# Patient Record
Sex: Female | Born: 1945 | Race: White | Hispanic: No | Marital: Single | Smoking: Never smoker
Health system: Southern US, Community
[De-identification: ages and names within clinical notes are randomized; demographics above are authoritative.]

## PROBLEM LIST (undated history)

## (undated) DIAGNOSIS — I10 Essential (primary) hypertension: Secondary | ICD-10-CM

## (undated) DIAGNOSIS — J45909 Unspecified asthma, uncomplicated: Secondary | ICD-10-CM

## (undated) DIAGNOSIS — K219 Gastro-esophageal reflux disease without esophagitis: Secondary | ICD-10-CM

## (undated) DIAGNOSIS — E785 Hyperlipidemia, unspecified: Secondary | ICD-10-CM

## (undated) HISTORY — DX: Gastro-esophageal reflux disease without esophagitis: K21.9

## (undated) HISTORY — PX: RHINOPLASTY: SUR1284

## (undated) HISTORY — PX: APPENDECTOMY: SHX54

## (undated) HISTORY — DX: Hyperlipidemia, unspecified: E78.5

## (undated) HISTORY — DX: Unspecified asthma, uncomplicated: J45.909

## (undated) HISTORY — DX: Essential (primary) hypertension: I10

---

## 2003-11-18 HISTORY — PX: JOINT REPLACEMENT: SHX530

## 2012-10-05 ENCOUNTER — Encounter: Payer: Self-pay | Admitting: Internal Medicine

## 2012-10-05 ENCOUNTER — Ambulatory Visit (INDEPENDENT_AMBULATORY_CARE_PROVIDER_SITE_OTHER): Payer: 59 | Admitting: Internal Medicine

## 2012-10-05 VITALS — BP 142/80 | HR 72 | Temp 97.8°F | Resp 18 | Ht 66.0 in | Wt 174.0 lb

## 2012-10-05 DIAGNOSIS — Z0000 Encounter for general adult medical examination without abnormal findings: Secondary | ICD-10-CM

## 2012-10-05 DIAGNOSIS — Z86010 Personal history of colonic polyps: Secondary | ICD-10-CM | POA: Insufficient documentation

## 2012-10-05 DIAGNOSIS — E785 Hyperlipidemia, unspecified: Secondary | ICD-10-CM | POA: Insufficient documentation

## 2012-10-05 DIAGNOSIS — J45909 Unspecified asthma, uncomplicated: Secondary | ICD-10-CM | POA: Insufficient documentation

## 2012-10-05 DIAGNOSIS — I10 Essential (primary) hypertension: Secondary | ICD-10-CM

## 2012-10-05 DIAGNOSIS — K219 Gastro-esophageal reflux disease without esophagitis: Secondary | ICD-10-CM | POA: Insufficient documentation

## 2012-10-05 MED ORDER — ALBUTEROL SULFATE HFA 108 (90 BASE) MCG/ACT IN AERS: 2.0000 | INHALATION_SPRAY | Freq: Four times a day (QID) | RESPIRATORY_TRACT | Status: DC | PRN

## 2012-10-05 MED ORDER — ATORVASTATIN CALCIUM 20 MG PO TABS: 20.0000 mg | ORAL_TABLET | Freq: Every day | ORAL | Status: DC

## 2012-10-05 MED ORDER — BENAZEPRIL HCL 20 MG PO TABS: 20.0000 mg | ORAL_TABLET | Freq: Every day | ORAL | Status: DC

## 2012-10-05 MED ORDER — TRIAMTERENE-HCTZ 37.5-25 MG PO CAPS: 1.0000 | ORAL_CAPSULE | ORAL | Status: DC

## 2012-10-05 MED ORDER — MONTELUKAST SODIUM 10 MG PO TABS: 10.0000 mg | ORAL_TABLET | Freq: Every day | ORAL | Status: DC

## 2012-10-05 MED ORDER — OMEPRAZOLE MAGNESIUM 20 MG PO TBEC: 20.0000 mg | DELAYED_RELEASE_TABLET | Freq: Every day | ORAL | Status: DC

## 2012-10-05 NOTE — Progress Notes (Signed)
  Subjective:    Patient ID: Judy [name], female    DOB: May 09, 1946, 66 y.o.   MRN: [id number]  HPI  66 year old patient who is seen today to establish with our practice. Past medical history is pertinent for hypertension dyslipidemia and a history of mild asthma. This has been well-controlled and she has only rare albuterol rescue use. 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  HENT: Negative for hearing loss, ear pain, nosebleeds, congestion, sore throat, mouth sores, trouble swallowing, neck stiffness, dental problem, voice change, sinus pressure and tinnitus.   Eyes: Negative for photophobia, pain, redness and visual disturbance.  Respiratory: Negative for cough, chest tightness and shortness of breath.   Cardiovascular: Negative for chest pain, palpitations and leg swelling.  Gastrointestinal: Negative for nausea, vomiting, abdominal pain, diarrhea, constipation, blood in stool, abdominal distention and rectal pain.  Genitourinary: Negative for dysuria, urgency, frequency, hematuria, flank pain, vaginal bleeding, vaginal discharge, difficulty urinating, genital sores, vaginal pain, menstrual problem and pelvic pain.  Musculoskeletal: Negative for back pain and arthralgias.  Skin: Negative for rash.  Neurological: Negative for dizziness, syncope, speech difficulty, weakness, light-headedness, numbness and headaches.  Hematological: Negative for adenopathy. Does not bruise/bleed easily.  Psychiatric/Behavioral: Negative for suicidal ideas, behavioral problems, self-injury, dysphoric mood and agitation. The patient is not  nervous/anxious.        Objective:   Physical Exam  Constitutional: She is oriented to person, place, and time. She appears well-developed and well-nourished.  HENT:  Head: Normocephalic and atraumatic.  Right Ear: External ear normal.  Left Ear: External ear normal.  Mouth/Throat: Oropharynx is clear and moist.  Eyes: Conjunctivae normal and EOM are normal.  Neck: Normal range of motion. Neck supple. No JVD present. No thyromegaly present.  Cardiovascular: Normal rate, regular rhythm, normal heart sounds and intact distal pulses.   No murmur heard. Pulmonary/Chest: Effort normal and breath sounds normal. She has no wheezes. She has no rales.  Abdominal: Soft. Bowel sounds are normal. She exhibits no distension and no mass. There is no tenderness. There is no rebound and no guarding.  Musculoskeletal: Normal range of motion. She exhibits no edema and no tenderness.  Neurological: She is alert and oriented to person, place, and time. She has normal reflexes. No cranial nerve deficit. She exhibits normal muscle tone. Coordination normal.  Skin: Skin is warm and dry. No rash noted.  Psychiatric: She has a normal mood and affect. Her behavior is normal.          Assessment & Plan:   Preventive health exam Hypertension controlled Dyslipidemia Mild stable asthma Gastroesophageal reflux disease  Recheck 1 year with lab

## 2012-10-05 NOTE — Patient Instructions (Addendum)
Limit your sodium (Salt) intake    It is important that you exercise regularly, at least 20 minutes 3 to 4 times per week.  If you develop chest pain or shortness of breath seek  medical attention.  Return in one year for follow-up  Schedule your mammogram.  

## 2012-11-29 ENCOUNTER — Other Ambulatory Visit (INDEPENDENT_AMBULATORY_CARE_PROVIDER_SITE_OTHER): Payer: 59

## 2012-11-29 DIAGNOSIS — Z0000 Encounter for general adult medical examination without abnormal findings: Secondary | ICD-10-CM

## 2012-11-29 LAB — CBC WITH DIFFERENTIAL/PLATELET
Basophils Absolute: 0 10*3/uL (ref 0.0–0.1)
Eosinophils Absolute: 0.2 10*3/uL (ref 0.0–0.7)
HCT: 39.2 % (ref 36.0–46.0)
Lymphs Abs: 1.6 10*3/uL (ref 0.7–4.0)
MCHC: 33.6 g/dL (ref 30.0–36.0)
Monocytes Absolute: 1.1 10*3/uL — ABNORMAL HIGH (ref 0.1–1.0)
Monocytes Relative: 16 % — ABNORMAL HIGH (ref 3.0–12.0)
Platelets: 289 10*3/uL (ref 150.0–400.0)
RDW: 13.1 % (ref 11.5–14.6)

## 2012-11-29 LAB — POCT URINALYSIS DIPSTICK
Bilirubin, UA: NEGATIVE
Glucose, UA: NEGATIVE
Nitrite, UA: NEGATIVE
Urobilinogen, UA: 0.2

## 2012-11-29 LAB — LIPID PANEL
HDL: 70.4 mg/dL (ref >39.00)
Triglycerides: 54 mg/dL (ref 0.0–149.0)

## 2012-11-29 LAB — HEPATIC FUNCTION PANEL
AST: 17 U/L (ref 0–37)
Total Bilirubin: 0.6 mg/dL (ref 0.3–1.2)

## 2012-11-29 LAB — BASIC METABOLIC PANEL
BUN: 28 mg/dL — ABNORMAL HIGH (ref 6–23)
CO2: 25 mEq/L (ref 19–32)
GFR: 52.47 mL/min — ABNORMAL LOW (ref >60.00)
Glucose, Bld: 90 mg/dL (ref 70–99)
Potassium: 4.2 mEq/L (ref 3.5–5.1)

## 2012-11-29 LAB — TSH: TSH: 2.34 u[IU]/mL (ref 0.35–5.50)

## 2012-11-30 ENCOUNTER — Other Ambulatory Visit: Payer: 59

## 2012-12-06 ENCOUNTER — Encounter: Payer: 59 | Admitting: Internal Medicine

## 2013-01-01 ENCOUNTER — Other Ambulatory Visit: Payer: Self-pay

## 2013-04-18 ENCOUNTER — Encounter: Payer: Self-pay | Admitting: Family Medicine

## 2013-04-18 ENCOUNTER — Ambulatory Visit (INDEPENDENT_AMBULATORY_CARE_PROVIDER_SITE_OTHER): Payer: Medicare Other | Admitting: Family Medicine

## 2013-04-18 VITALS — BP 102/72 | Temp 98.5°F | Wt 174.0 lb

## 2013-04-18 DIAGNOSIS — H6121 Impacted cerumen, right ear: Secondary | ICD-10-CM

## 2013-04-18 DIAGNOSIS — H6120 Impacted cerumen, unspecified ear: Secondary | ICD-10-CM

## 2013-04-18 NOTE — Progress Notes (Signed)
Chief Complaint  Patient presents with  . right ear pain    HPI:  Acute visit for R ear fullness: -started about 4-5 days ago -has been on several flights -feels like ear wax as has had this issue before and had ear wax lavage for this -symptoms: nasal congestion, PND, R ear feels full, a little vertigo - has had vertigo in the past and told to take dramamine - which has resolved current vertigo -denies: fevers, sinus pain, SOB, NVD    ROS: See pertinent positives and negatives per HPI.  Past Medical History  Diagnosis Date  . GERD (gastroesophageal reflux disease)   . Hypertension   . Hyperlipidemia   . Asthma     No family history on file.  History   Social History  . Marital Status: Single    Spouse Name: N/A    Number of Children: N/A  . Years of Education: N/A   Social History Main Topics  . Smoking status: Never Smoker   . Smokeless tobacco: Never Used  . Alcohol Use: 1.8 oz/week    3 Glasses of wine per week  . Drug Use: No  . Sexually Active: No   Other Topics Concern  . None   Social History Narrative  . None    Current outpatient prescriptions:albuterol (PROVENTIL HFA;VENTOLIN HFA) 108 (90 BASE) MCG/ACT inhaler, Inhale 2 puffs into the lungs every 6 (six) hours as needed., Disp: 1 Inhaler, Rfl: 6;  atorvastatin (LIPITOR) 20 MG tablet, Take 1 tablet (20 mg total) by mouth daily., Disp: 90 tablet, Rfl: 4;  benazepril (LOTENSIN) 20 MG tablet, Take 1 tablet (20 mg total) by mouth daily., Disp: 90 tablet, Rfl: 5 montelukast (SINGULAIR) 10 MG tablet, Take 1 tablet (10 mg total) by mouth at bedtime., Disp: 90 tablet, Rfl: 5;  Multiple Vitamins-Minerals (MULTIVITAMIN WITH MINERALS) tablet, Take 1 tablet by mouth daily., Disp: , Rfl: ;  naproxen sodium (ANAPROX) 220 MG tablet, Take 220 mg by mouth 1 day or 1 dose., Disp: , Rfl: ;  omeprazole (PRILOSEC OTC) 20 MG tablet, Take 1 tablet (20 mg total) by mouth daily., Disp: 90 tablet, Rfl:  4 triamterene-hydrochlorothiazide (DYAZIDE) 37.5-25 MG per capsule, Take 1 each (1 capsule total) by mouth every morning., Disp: 90 capsule, Rfl: 5  EXAM:  Filed Vitals:   04/18/13 1508  BP: 102/72  Temp: 98.5 F (36.9 C)    Body mass index is 28.1 kg/(m^2).  GENERAL: vitals reviewed and listed above, alert, oriented, appears well hydrated and in no acute distress  HEENT: atraumatic, conjunttiva clear, no obvious abnormalities on inspection of external nose and ears, cerumen impaction R ear canal, normal appearance of ear canals and TMs after removal of ear wax other then some scarring of R TM, clear nasal congestion, mild post oropharyngeal erythema with PND, no tonsillar edema or exudate, no sinus TTP  NECK: no obvious masses on inspection  MS: moves all extremities without noticeable abnormality  PSYCH: pleasant and cooperative, no obvious depression or anxiety  ASSESSMENT AND PLAN:  Discussed the following assessment and plan:  Cerumen impaction, right  -some ear wax removed with soft curette, then nurse removal with lavage after discussion risks - symptoms resolved after removal -return and follow up precautions -Patient advised to return or notify a doctor immediately if symptoms worsen or persist or new concerns arise.  There are no Patient Instructions on file for this visit.   Kriste Basque R.

## 2013-04-20 ENCOUNTER — Telehealth: Payer: Self-pay | Admitting: Internal Medicine

## 2013-04-20 ENCOUNTER — Ambulatory Visit (INDEPENDENT_AMBULATORY_CARE_PROVIDER_SITE_OTHER): Payer: Medicare Other | Admitting: Family Medicine

## 2013-04-20 ENCOUNTER — Encounter: Payer: Self-pay | Admitting: Family Medicine

## 2013-04-20 VITALS — BP 120/80 | Temp 98.3°F | Wt 174.0 lb

## 2013-04-20 DIAGNOSIS — J329 Chronic sinusitis, unspecified: Secondary | ICD-10-CM

## 2013-04-20 MED ORDER — AMOXICILLIN 875 MG PO TABS: 875.0000 mg | ORAL_TABLET | Freq: Two times a day (BID) | ORAL | Status: DC

## 2013-04-20 NOTE — Progress Notes (Signed)
Chief Complaint  Patient presents with  . Sinusitis    HPI:  Sinus congestion: -started a few days ago right after last visit, ears feel better but now having sinus issues -symptoms: nasal congestion, green drainage, sore throat, R sided tooth pain upper and R max sinus pain -denies: fevers, ear pain, SOB, NVD -tried nettie pot -reports hx recurrent sinus infections years ago   ROS: See pertinent positives and negatives per HPI.  Past Medical History  Diagnosis Date  . GERD (gastroesophageal reflux disease)   . Hypertension   . Hyperlipidemia   . Asthma     No family history on file.  History   Social History  . Marital Status: Single    Spouse Name: N/A    Number of Children: N/A  . Years of Education: N/A   Social History Main Topics  . Smoking status: Never Smoker   . Smokeless tobacco: Never Used  . Alcohol Use: 1.8 oz/week    3 Glasses of wine per week  . Drug Use: No  . Sexually Active: No   Other Topics Concern  . None   Social History Narrative  . None    Current outpatient prescriptions:albuterol (PROVENTIL HFA;VENTOLIN HFA) 108 (90 BASE) MCG/ACT inhaler, Inhale 2 puffs into the lungs every 6 (six) hours as needed., Disp: 1 Inhaler, Rfl: 6;  amoxicillin (AMOXIL) 875 MG tablet, Take 1 tablet (875 mg total) by mouth 2 (two) times daily., Disp: 20 tablet, Rfl: 0;  atorvastatin (LIPITOR) 20 MG tablet, Take 1 tablet (20 mg total) by mouth daily., Disp: 90 tablet, Rfl: 4 benazepril (LOTENSIN) 20 MG tablet, Take 1 tablet (20 mg total) by mouth daily., Disp: 90 tablet, Rfl: 5;  montelukast (SINGULAIR) 10 MG tablet, Take 1 tablet (10 mg total) by mouth at bedtime., Disp: 90 tablet, Rfl: 5;  Multiple Vitamins-Minerals (MULTIVITAMIN WITH MINERALS) tablet, Take 1 tablet by mouth daily., Disp: , Rfl: ;  naproxen sodium (ANAPROX) 220 MG tablet, Take 220 mg by mouth 1 day or 1 dose., Disp: , Rfl:  omeprazole (PRILOSEC OTC) 20 MG tablet, Take 1 tablet (20 mg total) by  mouth daily., Disp: 90 tablet, Rfl: 4;  triamterene-hydrochlorothiazide (DYAZIDE) 37.5-25 MG per capsule, Take 1 each (1 capsule total) by mouth every morning., Disp: 90 capsule, Rfl: 5  EXAM:  Filed Vitals:   04/20/13 1143  BP: 120/80  Temp: 98.3 F (36.8 C)    Body mass index is 28.1 kg/(m^2).  GENERAL: vitals reviewed and listed above, alert, oriented, appears well hydrated and in no acute distress  HEENT: atraumatic, conjunttiva clear, no obvious abnormalities on inspection of external nose and ears, normal appearance of ear canals and TMs, thick white nasal congestion R>L, mild post oropharyngeal erythema with PND, no tonsillar edema or exudate, R max sinus TTP  NECK: no obvious masses on inspection  LUNGS: clear to auscultation bilaterally, no wheezes, rales or rhonchi, good air movement  CV: HRRR, no peripheral edema  MS: moves all extremities without noticeable abnormality  PSYCH: pleasant and cooperative, no obvious depression or anxiety  ASSESSMENT AND PLAN:  Discussed the following assessment and plan:  Sinusitis - Plan: amoxicillin (AMOXIL) 875 MG tablet  -given sinus pain and max tooth pain and exam findings discussed abx reasonable. -Recommendations per orders an instructions, risks and use of medications and return precautions discussed. -Patient advised to return or notify a doctor immediately if symptoms worsen or persist or new concerns arise.  Patient Instructions  --As we discussed, we  have prescribed a new medication (AMOXICILLIN) for you at this appointment. We discussed the common and serious potential adverse effects of this medication and you can review these and more with the pharmacist when you pick up your medication.  Please follow the instructions for use carefully and notify us immediately if you have any problems taking this medication.  -follow up as needed     KIM, Dahlia Client R.

## 2013-04-20 NOTE — Telephone Encounter (Signed)
Patient Information:  Caller Name: Judy [name]  Phone: [phone]  Patient: Judy [name], Judy [name]  Gender: Female  DOB: 09-17-1946  Age: 67 Years  PCP: Eleonore Chiquito [facility])  Office Follow Up:  Does the office need to follow up with this patient?: No  Instructions For The Office: N/A   Symptoms  Reason For Call & Symptoms: Seen in the office a few days ago for ears blocked and wax removed. She has been very congested for past 3-4 days and is clearing throat frequently and has greenish sputum. R side of throat is sore. R cheek tender and she is has mild headache. R side of jaw and teeth tender. Fever last night= 100.0 oral.  Reviewed Health History In EMR: Yes  Reviewed Medications In EMR: Yes  Reviewed Allergies In EMR: Yes  Reviewed Surgeries / Procedures: Yes  Date of Onset of Symptoms: 04/17/2013  Treatments Tried: Neti Pot, Alleve  Treatments Tried Worked: No  Guideline(s) Used:  Sinus Pain and Congestion  Disposition Per Guideline:   See Today in Office  Reason For Disposition Reached:   Sinus pain (not just congestion) and fever  Advice Given:  Reassurance:   Sinus congestion is a normal part of a cold.  Usually home treatment with nasal washes can prevent an actual bacterial sinus infection.  Antibiotics are not helpful for the sinus congestion that occurs with colds.  Hydration:  Drink plenty of liquids (6-8 glasses of water daily). If the air in your home is dry, use a cool mist humidifier  Expected Course:  Sinus congestion from viral upper respiratory infections (colds) usually lasts 5-10 days.  Occasionally a cold can worsen and turn into bacterial sinusitis. Clues to this are sinus symptoms lasting longer than 10 days, fever lasting longer than 3 days, and worsening pain. Bacterial sinusitis may need antibiotic treatment.  Patient Will Follow Care Advice:  YES  Appointment Scheduled:  04/20/2013 11:40:00 Appointment Scheduled Provider:  Kriste Basque  [facility])

## 2013-04-20 NOTE — Patient Instructions (Addendum)
--  As we discussed, we have prescribed a new medication (AMOXICILLIN) for you at this appointment. We discussed the common and serious potential adverse effects of this medication and you can review these and more with the pharmacist when you pick up your medication.  Please follow the instructions for use carefully and notify us immediately if you have any problems taking this medication.  -follow up as needed

## 2013-06-22 ENCOUNTER — Other Ambulatory Visit: Payer: Self-pay

## 2013-09-22 ENCOUNTER — Other Ambulatory Visit: Payer: Self-pay

## 2013-10-14 ENCOUNTER — Other Ambulatory Visit: Payer: Self-pay | Admitting: Internal Medicine

## 2015-11-20 DIAGNOSIS — N6001 Solitary cyst of right breast: Secondary | ICD-10-CM | POA: Diagnosis not present

## 2015-11-20 DIAGNOSIS — N6011 Diffuse cystic mastopathy of right breast: Secondary | ICD-10-CM | POA: Diagnosis not present

## 2015-11-20 DIAGNOSIS — N6489 Other specified disorders of breast: Secondary | ICD-10-CM | POA: Diagnosis not present

## 2016-01-16 DIAGNOSIS — Z96642 Presence of left artificial hip joint: Secondary | ICD-10-CM | POA: Diagnosis not present

## 2016-01-16 DIAGNOSIS — Z471 Aftercare following joint replacement surgery: Secondary | ICD-10-CM | POA: Diagnosis not present

## 2016-03-26 DIAGNOSIS — L570 Actinic keratosis: Secondary | ICD-10-CM | POA: Diagnosis not present

## 2016-03-26 DIAGNOSIS — L814 Other melanin hyperpigmentation: Secondary | ICD-10-CM | POA: Diagnosis not present

## 2016-03-26 DIAGNOSIS — Z85828 Personal history of other malignant neoplasm of skin: Secondary | ICD-10-CM | POA: Diagnosis not present

## 2016-03-26 DIAGNOSIS — L579 Skin changes due to chronic exposure to nonionizing radiation, unspecified: Secondary | ICD-10-CM | POA: Diagnosis not present

## 2016-08-21 DIAGNOSIS — Z23 Encounter for immunization: Secondary | ICD-10-CM | POA: Diagnosis not present

## 2016-09-15 DIAGNOSIS — F4322 Adjustment disorder with anxiety: Secondary | ICD-10-CM | POA: Diagnosis not present

## 2016-09-15 DIAGNOSIS — R002 Palpitations: Secondary | ICD-10-CM | POA: Diagnosis not present

## 2016-09-15 DIAGNOSIS — I493 Ventricular premature depolarization: Secondary | ICD-10-CM | POA: Diagnosis not present

## 2016-10-23 DIAGNOSIS — L570 Actinic keratosis: Secondary | ICD-10-CM | POA: Diagnosis not present

## 2016-10-24 DIAGNOSIS — R1031 Right lower quadrant pain: Secondary | ICD-10-CM | POA: Diagnosis not present

## 2016-10-24 DIAGNOSIS — R1010 Upper abdominal pain, unspecified: Secondary | ICD-10-CM | POA: Diagnosis not present

## 2016-10-27 DIAGNOSIS — R109 Unspecified abdominal pain: Secondary | ICD-10-CM | POA: Diagnosis not present

## 2016-10-29 DIAGNOSIS — K7689 Other specified diseases of liver: Secondary | ICD-10-CM | POA: Diagnosis not present

## 2016-11-03 DIAGNOSIS — R1011 Right upper quadrant pain: Secondary | ICD-10-CM | POA: Diagnosis not present

## 2016-11-03 DIAGNOSIS — K5900 Constipation, unspecified: Secondary | ICD-10-CM | POA: Diagnosis not present

## 2016-11-03 DIAGNOSIS — R140 Abdominal distension (gaseous): Secondary | ICD-10-CM | POA: Diagnosis not present

## 2016-11-03 DIAGNOSIS — K87 Disorders of gallbladder, biliary tract and pancreas in diseases classified elsewhere: Secondary | ICD-10-CM | POA: Diagnosis not present

## 2016-11-03 DIAGNOSIS — E039 Hypothyroidism, unspecified: Secondary | ICD-10-CM | POA: Diagnosis not present

## 2016-11-03 DIAGNOSIS — K828 Other specified diseases of gallbladder: Secondary | ICD-10-CM | POA: Diagnosis not present

## 2016-11-11 DIAGNOSIS — L570 Actinic keratosis: Secondary | ICD-10-CM | POA: Diagnosis not present

## 2016-12-03 DIAGNOSIS — R1033 Periumbilical pain: Secondary | ICD-10-CM | POA: Diagnosis not present

## 2016-12-03 DIAGNOSIS — R1010 Upper abdominal pain, unspecified: Secondary | ICD-10-CM | POA: Diagnosis not present

## 2016-12-03 DIAGNOSIS — I10 Essential (primary) hypertension: Secondary | ICD-10-CM | POA: Diagnosis not present

## 2016-12-03 DIAGNOSIS — R1031 Right lower quadrant pain: Secondary | ICD-10-CM | POA: Diagnosis not present

## 2016-12-04 DIAGNOSIS — R1033 Periumbilical pain: Secondary | ICD-10-CM | POA: Diagnosis not present

## 2016-12-04 DIAGNOSIS — R1031 Right lower quadrant pain: Secondary | ICD-10-CM | POA: Diagnosis not present

## 2016-12-04 DIAGNOSIS — R1010 Upper abdominal pain, unspecified: Secondary | ICD-10-CM | POA: Diagnosis not present

## 2016-12-05 DIAGNOSIS — K3589 Other acute appendicitis: Secondary | ICD-10-CM | POA: Diagnosis not present

## 2016-12-05 DIAGNOSIS — K37 Unspecified appendicitis: Secondary | ICD-10-CM | POA: Diagnosis not present

## 2016-12-05 DIAGNOSIS — Z01818 Encounter for other preprocedural examination: Secondary | ICD-10-CM | POA: Diagnosis not present

## 2016-12-05 DIAGNOSIS — R1031 Right lower quadrant pain: Secondary | ICD-10-CM | POA: Diagnosis not present

## 2016-12-05 DIAGNOSIS — I10 Essential (primary) hypertension: Secondary | ICD-10-CM | POA: Diagnosis not present

## 2016-12-05 DIAGNOSIS — K3580 Unspecified acute appendicitis: Secondary | ICD-10-CM | POA: Diagnosis not present

## 2016-12-06 DIAGNOSIS — K3580 Unspecified acute appendicitis: Secondary | ICD-10-CM | POA: Diagnosis not present

## 2016-12-06 DIAGNOSIS — I10 Essential (primary) hypertension: Secondary | ICD-10-CM | POA: Diagnosis not present

## 2016-12-31 DIAGNOSIS — R1084 Generalized abdominal pain: Secondary | ICD-10-CM | POA: Diagnosis not present

## 2017-02-12 DIAGNOSIS — I10 Essential (primary) hypertension: Secondary | ICD-10-CM | POA: Diagnosis not present

## 2017-02-12 DIAGNOSIS — R1031 Right lower quadrant pain: Secondary | ICD-10-CM | POA: Diagnosis not present

## 2017-02-18 DIAGNOSIS — R1031 Right lower quadrant pain: Secondary | ICD-10-CM | POA: Diagnosis not present

## 2017-04-08 DIAGNOSIS — E785 Hyperlipidemia, unspecified: Secondary | ICD-10-CM | POA: Diagnosis not present

## 2017-04-08 DIAGNOSIS — E039 Hypothyroidism, unspecified: Secondary | ICD-10-CM | POA: Diagnosis not present

## 2017-04-08 DIAGNOSIS — F4322 Adjustment disorder with anxiety: Secondary | ICD-10-CM | POA: Diagnosis not present

## 2017-04-08 DIAGNOSIS — G4700 Insomnia, unspecified: Secondary | ICD-10-CM | POA: Diagnosis not present

## 2017-04-08 LAB — BASIC METABOLIC PANEL
BUN: 14 (ref 4–21)
CREATININE: 0.7 (ref 0.5–1.1)
GLUCOSE: 100
POTASSIUM: 4.1 (ref 3.4–5.3)
SODIUM: 137 (ref 137–147)

## 2017-04-08 LAB — LIPID PANEL
Cholesterol: 173 (ref 0–200)
HDL: 101 — AB (ref 35–70)
LDL Cholesterol: 60
Triglycerides: 61 (ref 40–160)

## 2017-04-08 LAB — HEPATIC FUNCTION PANEL
ALT: 38 — AB (ref 7–35)
AST: 24 (ref 13–35)
Alkaline Phosphatase: 104 (ref 25–125)
BILIRUBIN, TOTAL: 0.3

## 2017-05-06 DIAGNOSIS — R7989 Other specified abnormal findings of blood chemistry: Secondary | ICD-10-CM | POA: Diagnosis not present

## 2017-05-28 DIAGNOSIS — L570 Actinic keratosis: Secondary | ICD-10-CM | POA: Diagnosis not present

## 2017-07-09 DIAGNOSIS — L570 Actinic keratosis: Secondary | ICD-10-CM | POA: Diagnosis not present

## 2017-07-09 DIAGNOSIS — Z85828 Personal history of other malignant neoplasm of skin: Secondary | ICD-10-CM | POA: Diagnosis not present

## 2017-07-28 ENCOUNTER — Encounter: Payer: Self-pay | Admitting: Obstetrics and Gynecology

## 2017-07-28 ENCOUNTER — Ambulatory Visit (INDEPENDENT_AMBULATORY_CARE_PROVIDER_SITE_OTHER): Payer: Medicare Other | Admitting: Obstetrics and Gynecology

## 2017-07-28 VITALS — BP 129/80 | HR 75 | Ht 67.0 in | Wt 140.0 lb

## 2017-07-28 DIAGNOSIS — Z01419 Encounter for gynecological examination (general) (routine) without abnormal findings: Secondary | ICD-10-CM

## 2017-07-28 DIAGNOSIS — Z9049 Acquired absence of other specified parts of digestive tract: Secondary | ICD-10-CM

## 2017-07-28 NOTE — Progress Notes (Signed)
Subjective:     Judy [name] is a 71 y.o. female postmenopausal for over 10 years who is here for a comprehensive physical exam. The patient reports some right lower quadrant pain which haas been present since her laparoscopic appendectomy in January 2018. Patient recently moved from [place] and has not had a chance to follow up. She also reports some vaginal dryness. She is not sexually active. She denies any urinary incontinence. She denies any vaginal bleeding, pelvic pain or abnormal discharge.  Past Medical History:  Diagnosis Date  . Asthma   . GERD (gastroesophageal reflux disease)   . Hyperlipidemia   . Hypertension    Past Surgical History:  Procedure Laterality Date  . APPENDECTOMY    . JOINT REPLACEMENT  2005   Left Hip   Family History  Problem Relation Age of Onset  . Diabetes Maternal Grandmother   . Hypertension Maternal Grandmother   . Stroke Maternal Grandmother   . Hypertension Mother   . Stroke Mother   . Diabetes Sister   . Cancer Maternal Aunt   . Breast cancer Maternal Aunt 60  . Ovarian cancer Neg Hx      Social History   Social History  . Marital status: Single    Spouse name: N/A  . Number of children: N/A  . Years of education: N/A   Occupational History  . Not on file.   Social History Main Topics  . Smoking status: Never Smoker  . Smokeless tobacco: Never Used  . Alcohol use 1.8 oz/week    3 Glasses of wine per week  . Drug use: No  . Sexual activity: No   Other Topics Concern  . Not on file   Social History Narrative  . No narrative on file   Health Maintenance  Topic Date Due  . Hepatitis C Screening  [birth year]  . TETANUS/TDAP  02/23/1967  . MAMMOGRAM  02/22/1998  . COLONOSCOPY  02/22/1998  . DEXA SCAN  02/22/2013  . PNA vac Low Risk Adult (1 of 2 - PCV13) 02/22/2013  . INFLUENZA VACCINE  06/17/2017       Review of Systems Pertinent items are noted in HPI.   Objective:  Blood pressure 129/80, pulse 75, height 5'  7" (1.702 m), weight 140 lb (63.5 kg).     GENERAL: Well-developed, well-nourished female in no acute distress.  HEENT: Normocephalic, atraumatic. Sclerae anicteric.  NECK: Supple. Normal thyroid.  LUNGS: Clear to auscultation bilaterally.  HEART: Regular rate and rhythm. BREASTS: Symmetric in size. No palpable masses or lymphadenopathy, skin changes, or nipple drainage. ABDOMEN: Soft, nontender, nondistended.  PELVIC: Normal external female genitalia. Vagina is pale and atrophic.  Normal discharge. Normal appearing cervix. Uterus is normal in size. No adnexal mass or tenderness. EXTREMITIES: No cyanosis, clubbing, or edema, 2+ distal pulses.    Assessment:    Healthy female exam.      Plan:    patient reports normal pap smear 3 years ago and declined pap smear today Screening mammogram ordered as it is due in December Patient referred to a PCP for health care maintenance Referral made to general surgery to evaluate RLQ pain- likely related to scar tissue Patient was previously prescribed estrace cream but was not able to afford it. She has been using a natural alternative with some relief in symptoms RTC prn See After Visit Summary for Counseling Recommendations

## 2017-08-04 ENCOUNTER — Other Ambulatory Visit: Payer: Self-pay | Admitting: Obstetrics and Gynecology

## 2017-08-04 DIAGNOSIS — Z1231 Encounter for screening mammogram for malignant neoplasm of breast: Secondary | ICD-10-CM

## 2017-08-07 ENCOUNTER — Ambulatory Visit (INDEPENDENT_AMBULATORY_CARE_PROVIDER_SITE_OTHER): Payer: Medicare Other | Admitting: Physician Assistant

## 2017-08-07 ENCOUNTER — Encounter: Payer: Self-pay | Admitting: Physician Assistant

## 2017-08-07 VITALS — BP 141/73 | HR 73 | Ht 67.0 in | Wt 141.0 lb

## 2017-08-07 DIAGNOSIS — K5901 Slow transit constipation: Secondary | ICD-10-CM

## 2017-08-07 DIAGNOSIS — R1031 Right lower quadrant pain: Secondary | ICD-10-CM | POA: Diagnosis not present

## 2017-08-07 DIAGNOSIS — E7800 Pure hypercholesterolemia, unspecified: Secondary | ICD-10-CM

## 2017-08-07 DIAGNOSIS — Z23 Encounter for immunization: Secondary | ICD-10-CM | POA: Diagnosis not present

## 2017-08-07 DIAGNOSIS — R710 Precipitous drop in hematocrit: Secondary | ICD-10-CM

## 2017-08-07 DIAGNOSIS — F5101 Primary insomnia: Secondary | ICD-10-CM

## 2017-08-07 DIAGNOSIS — I10 Essential (primary) hypertension: Secondary | ICD-10-CM

## 2017-08-07 NOTE — Patient Instructions (Addendum)
Will evaluate low hemoglobin.  Will get CT scan.

## 2017-08-08 LAB — CBC WITH DIFFERENTIAL/PLATELET
Basophils Absolute: 42 cells/uL (ref 0–200)
Basophils Relative: 0.7 %
EOS ABS: 60 {cells}/uL (ref 15–500)
Eosinophils Relative: 1 %
HCT: 37.1 % (ref 35.0–45.0)
Hemoglobin: 13 g/dL (ref 11.7–15.5)
Lymphs Abs: 1410 cells/uL (ref 850–3900)
MCH: 34.3 pg — ABNORMAL HIGH (ref 27.0–33.0)
MCHC: 35 g/dL (ref 32.0–36.0)
MCV: 97.9 fL (ref 80.0–100.0)
MONOS PCT: 11.5 %
MPV: 9.1 fL (ref 7.5–12.5)
NEUTROS PCT: 63.3 %
Neutro Abs: 3798 cells/uL (ref 1500–7800)
PLATELETS: 286 10*3/uL (ref 140–400)
RBC: 3.79 10*6/uL — ABNORMAL LOW (ref 3.80–5.10)
RDW: 12.3 % (ref 11.0–15.0)
TOTAL LYMPHOCYTE: 23.5 %
WBC mixed population: 690 cells/uL (ref 200–950)
WBC: 6 10*3/uL (ref 3.8–10.8)

## 2017-08-08 LAB — IRON,TIBC AND FERRITIN PANEL
%SAT: 56 % (calc) — ABNORMAL HIGH (ref 11–50)
Ferritin: 150 ng/mL (ref 20–288)
IRON: 157 ug/dL (ref 45–160)
TIBC: 279 mcg/dL (calc) (ref 250–450)

## 2017-08-10 ENCOUNTER — Encounter: Payer: Self-pay | Admitting: Physician Assistant

## 2017-08-10 DIAGNOSIS — R1031 Right lower quadrant pain: Secondary | ICD-10-CM | POA: Insufficient documentation

## 2017-08-10 DIAGNOSIS — F5101 Primary insomnia: Secondary | ICD-10-CM | POA: Insufficient documentation

## 2017-08-10 DIAGNOSIS — R710 Precipitous drop in hematocrit: Secondary | ICD-10-CM | POA: Insufficient documentation

## 2017-08-10 NOTE — Progress Notes (Signed)
   Subjective:    Patient ID: Judy [name], female    DOB: 03-19-1946, 71 y.o.   MRN: [id number]  HPI Pt is a 71 yo female who presents to the clinic to establish care.   .. Active Ambulatory Problems    Diagnosis Date Noted  . Hypertension 10/05/2012  . Hyperlipidemia 10/05/2012  . Asthma 10/05/2012  . GERD (gastroesophageal reflux disease) 10/05/2012  . History of colonic polyps 10/05/2012  . Right lower quadrant abdominal pain 08/10/2017  . Primary insomnia 08/10/2017  . Decreased hemoglobin 08/10/2017   Resolved Ambulatory Problems    Diagnosis Date Noted  . No Resolved Ambulatory Problems   Past Medical History:  Diagnosis Date  . Asthma   . GERD (gastroesophageal reflux disease)   . Hyperlipidemia   . Hypertension    .Marland Kitchen Family History  Problem Relation Age of Onset  . Diabetes Maternal Grandmother   . Hypertension Maternal Grandmother   . Stroke Maternal Grandmother   . Hypertension Mother   . Stroke Mother   . Diabetes Sister   . Cancer Maternal Aunt   . Breast cancer Maternal Aunt 60  . Ovarian cancer Neg Hx    Pt is the most concerned with her constant right lower quadrant pain that has been persisent since her appendectomy in January 2018. Surgeon stated to follow up with PCP. No fever, chills, melena, hematochezia. Bowel movements are unchanged. Movement at times makes worse. No blunt force trauma. She did "stretch her abdomen" reaching for a glass one day.    Review of Systems  All other systems reviewed and are negative.      Objective:   Physical Exam  Constitutional: She is oriented to person, place, and time. She appears well-developed and well-nourished.  HENT:  Head: Normocephalic and atraumatic.  Neck: Normal range of motion. Neck supple.  Cardiovascular: Normal rate, regular rhythm and normal heart sounds.   Pulmonary/Chest: Effort normal and breath sounds normal. She has no wheezes.  No CVA tenderness.   Abdominal: Soft. Bowel sounds are  normal. She exhibits no distension and no mass. There is tenderness. There is no rebound and no guarding.  Moderate tenderness to palpation over right lower quadrant. No rebound or guarding.   Neurological: She is alert and oriented to person, place, and time.  Psychiatric: She has a normal mood and affect. Her behavior is normal.          Assessment & Plan:  Marland KitchenMarland KitchenMckaylin was seen today for establish care and pain after appendectomy.  Diagnoses and all orders for this visit:  Right lower quadrant abdominal pain -     CT Abdomen Pelvis W Contrast; Future -     CT Abdomen Pelvis W Contrast  Need for immunization against influenza -     Flu Vaccine QUAD 36+ mos IM  Decreased hemoglobin -     CBC with Differential/Platelet -     Iron, TIBC and Ferritin Panel  Essential hypertension  Pure hypercholesterolemia  Primary insomnia   Will get CT to evaluate abdominal pain. Unclear etiology sounds like could be adhesion but patient reports pain that she cannot live with. May need to refer to another surgeon for laporscopic exploration.  Labs to evaluate hx of low hgb.

## 2017-08-13 ENCOUNTER — Other Ambulatory Visit: Payer: Self-pay | Admitting: Physician Assistant

## 2017-08-13 DIAGNOSIS — I10 Essential (primary) hypertension: Secondary | ICD-10-CM

## 2017-08-14 ENCOUNTER — Other Ambulatory Visit: Payer: Self-pay | Admitting: General Practice

## 2017-08-14 DIAGNOSIS — E871 Hypo-osmolality and hyponatremia: Secondary | ICD-10-CM

## 2017-08-14 LAB — COMPLETE METABOLIC PANEL WITH GFR
AG RATIO: 2.4 (calc) (ref 1.0–2.5)
ALBUMIN MSPROF: 4.3 g/dL (ref 3.6–5.1)
ALKALINE PHOSPHATASE (APISO): 82 U/L (ref 33–130)
ALT: 18 U/L (ref 6–29)
AST: 17 U/L (ref 10–35)
BILIRUBIN TOTAL: 0.5 mg/dL (ref 0.2–1.2)
BUN: 12 mg/dL (ref 7–25)
CO2: 26 mmol/L (ref 20–32)
CREATININE: 0.65 mg/dL (ref 0.50–0.99)
Calcium: 9.4 mg/dL (ref 8.6–10.4)
Chloride: 94 mmol/L — ABNORMAL LOW (ref 98–110)
GFR, Est African American: 105 mL/min/{1.73_m2} (ref >=60)
GFR, Est Non African American: 91 mL/min/{1.73_m2} (ref >=60)
GLOBULIN: 1.8 g/dL — AB (ref 1.9–3.7)
Glucose, Bld: 98 mg/dL (ref 65–99)
POTASSIUM: 3.7 mmol/L (ref 3.5–5.3)
SODIUM: 130 mmol/L — AB (ref 135–146)
Total Protein: 6.1 g/dL (ref 6.1–8.1)

## 2017-08-14 NOTE — Progress Notes (Signed)
Lab ordered for recheck  

## 2017-08-14 NOTE — Progress Notes (Signed)
Call pt: sodium is low. Has this ever been a problem for you in the past. Recheck in 2 weeks with lab.

## 2017-08-17 ENCOUNTER — Encounter: Payer: Self-pay | Admitting: Physician Assistant

## 2017-08-17 ENCOUNTER — Ambulatory Visit (HOSPITAL_BASED_OUTPATIENT_CLINIC_OR_DEPARTMENT_OTHER)
Admission: RE | Admit: 2017-08-17 | Discharge: 2017-08-17 | Disposition: A | Discharge status: Home / Self Care | Payer: Medicare Other | Source: Ambulatory Visit | Attending: Physician Assistant | Admitting: Physician Assistant

## 2017-08-17 ENCOUNTER — Telehealth: Payer: Self-pay | Admitting: General Surgery

## 2017-08-17 DIAGNOSIS — R195 Other fecal abnormalities: Secondary | ICD-10-CM | POA: Insufficient documentation

## 2017-08-17 DIAGNOSIS — Z9049 Acquired absence of other specified parts of digestive tract: Secondary | ICD-10-CM | POA: Diagnosis not present

## 2017-08-17 DIAGNOSIS — K5901 Slow transit constipation: Secondary | ICD-10-CM | POA: Insufficient documentation

## 2017-08-17 DIAGNOSIS — I700 Atherosclerosis of aorta: Secondary | ICD-10-CM | POA: Insufficient documentation

## 2017-08-17 DIAGNOSIS — R1031 Right lower quadrant pain: Secondary | ICD-10-CM | POA: Diagnosis present

## 2017-08-17 DIAGNOSIS — K5900 Constipation, unspecified: Secondary | ICD-10-CM | POA: Diagnosis not present

## 2017-08-17 MED ORDER — IOPAMIDOL (ISOVUE-300) INJECTION 61%
100.0000 mL | Freq: Once | INTRAVENOUS | Status: AC | PRN
  Administered 2017-08-17: 100 mL via INTRAVENOUS

## 2017-08-17 MED ORDER — POLYETHYLENE GLYCOL 3350 17 G PO PACK: 17.0000 g | PACK | Freq: Two times a day (BID) | ORAL | 3 refills | Status: DC

## 2017-08-17 NOTE — Telephone Encounter (Signed)
Patient was scheduled with [facility] for 10/4 for consult. She is unable to get transportation from [place] and is requesting a Careers adviser closer to her home. Please advise

## 2017-08-17 NOTE — Telephone Encounter (Signed)
Patient called wanting to know if we had General Surgeons in the [place] area. I told her that unfortunately we did not. She stated that she lived in [place] and that it will be extremely far for her to drive to [place]. She stated that she will call her PA to let her know if there is anyway that she could go elsewhere in the [place] area. I told patient that it was her choice and if once she spoke to her PA and both agreed on her coming to [place], then to give [place] a call and we would gladly give her an appointment for a consult. Patient agreed and had no further questions.

## 2017-08-17 NOTE — Addendum Note (Signed)
Addended by: Jomarie Longs on: 08/17/2017 10:09 PM   Modules accepted: Orders

## 2017-08-18 NOTE — Progress Notes (Signed)
Yes mam. We will enter in to chart.   Referral made.

## 2017-08-18 NOTE — Addendum Note (Signed)
Addended by: Jomarie Longs on: 08/18/2017 01:46 PM   Modules accepted: Orders

## 2017-08-20 ENCOUNTER — Ambulatory Visit: Payer: Self-pay | Admitting: General Surgery

## 2017-08-20 DIAGNOSIS — K5900 Constipation, unspecified: Secondary | ICD-10-CM | POA: Diagnosis not present

## 2017-08-20 DIAGNOSIS — R1031 Right lower quadrant pain: Secondary | ICD-10-CM | POA: Diagnosis not present

## 2017-08-31 DIAGNOSIS — E871 Hypo-osmolality and hyponatremia: Secondary | ICD-10-CM | POA: Diagnosis not present

## 2017-09-02 ENCOUNTER — Encounter: Payer: Self-pay | Admitting: Physician Assistant

## 2017-09-02 LAB — COMPLETE METABOLIC PANEL WITH GFR
AG Ratio: 2.6 (calc) — ABNORMAL HIGH (ref 1.0–2.5)
ALT: 20 U/L (ref 6–29)
AST: 19 U/L (ref 10–35)
Albumin: 4.2 g/dL (ref 3.6–5.1)
Alkaline phosphatase (APISO): 75 U/L (ref 33–130)
BILIRUBIN TOTAL: 0.9 mg/dL (ref 0.2–1.2)
BUN: 15 mg/dL (ref 7–25)
CALCIUM: 9.4 mg/dL (ref 8.6–10.4)
CO2: 27 mmol/L (ref 20–32)
CREATININE: 0.73 mg/dL (ref 0.50–0.99)
Chloride: 93 mmol/L — ABNORMAL LOW (ref 98–110)
GFR, EST AFRICAN AMERICAN: 97 mL/min/{1.73_m2} (ref >=60)
GFR, EST NON AFRICAN AMERICAN: 84 mL/min/{1.73_m2} (ref >=60)
GLUCOSE: 82 mg/dL (ref 65–99)
Globulin: 1.6 g/dL (calc) — ABNORMAL LOW (ref 1.9–3.7)
POTASSIUM: 4.2 mmol/L (ref 3.5–5.3)
Sodium: 130 mmol/L — ABNORMAL LOW (ref 135–146)
TOTAL PROTEIN: 5.8 g/dL — AB (ref 6.1–8.1)

## 2017-09-02 LAB — TEST AUTHORIZATION

## 2017-09-02 LAB — OSMOLALITY: Osmolality: 265 mOsm/kg — ABNORMAL LOW (ref 278–305)

## 2017-09-02 NOTE — Addendum Note (Signed)
Addended by: Deirdre PippinsALEXANDER, Graclyn Lawther M on: 09/02/2017 07:57 AM   Modules accepted: Orders

## 2017-09-09 ENCOUNTER — Encounter: Payer: Self-pay | Admitting: Physician Assistant

## 2017-09-09 ENCOUNTER — Ambulatory Visit (INDEPENDENT_AMBULATORY_CARE_PROVIDER_SITE_OTHER): Payer: Medicare Other | Admitting: Physician Assistant

## 2017-09-09 VITALS — BP 100/63 | HR 86 | Ht 67.0 in | Wt 141.0 lb

## 2017-09-09 DIAGNOSIS — K5904 Chronic idiopathic constipation: Secondary | ICD-10-CM | POA: Diagnosis not present

## 2017-09-09 DIAGNOSIS — E871 Hypo-osmolality and hyponatremia: Secondary | ICD-10-CM | POA: Diagnosis not present

## 2017-09-09 DIAGNOSIS — I952 Hypotension due to drugs: Secondary | ICD-10-CM | POA: Diagnosis not present

## 2017-09-09 MED ORDER — PLECANATIDE 3 MG PO TABS: 1.0000 | ORAL_TABLET | Freq: Every day | ORAL | 2 refills | Status: DC

## 2017-09-09 NOTE — Patient Instructions (Addendum)
Keep check BP. StOP Diazide.  Follow up in 3 weeks.  Hyponatremia Hyponatremia is when the amount of salt (sodium) in your blood is too low. When salt levels are low, your cells absorb extra water and they swell. The swelling happens throughout the body, but it mostly affects the brain. Follow these instructions at home:  Take medicines only as told by your doctor. Many medicines can make this condition worse. Talk with your doctor about any medicines that you are currently taking.  Carefully follow a recommended diet as told by your doctor.  Carefully follow instructions from your doctor about fluid restrictions.  Keep all follow-up visits as told by your doctor. This is important.  Do not drink alcohol. Contact a doctor if:  You feel sicker to your stomach (nauseous).  You feel more confused.  You feel more tired (fatigued).  Your headache gets worse.  You feel weaker.  Your symptoms go away and then they come back.  You have trouble following the diet instructions. Get help right away if:  You start to twitch and shake (have a seizure).  You pass out (faint).  You keep having watery poop (diarrhea).  You keep throwing up (vomiting). This information is not intended to replace advice given to you by your health care provider. Make sure you discuss any questions you have with your health care provider. Document Released: 07/16/2011 Document Revised: 04/10/2016 Document Reviewed: 10/30/2014 Elsevier Interactive Patient Education  [employer]2018 [employer]

## 2017-09-09 NOTE — Progress Notes (Signed)
Subjective:    Patient ID: Judy [name], female    DOB: 03/30/1946, 71 y.o.   MRN: [id number]  HPI   Pt is a 71 yo female who presents to the clinic to discuss labs with low sodium of 130 and a serum osmolatiy of 285 just barely low. . Per pt she has never been aware of low sodium.she just moved to the area. She recently saw me for some ongoing RLQ pain after appendiectomy. CT was normal and sent her to GI to see if he could figure out where the pain is coming from. He feels like could be her brain sending pain signal and gave her doxepin.she stopped due to "feeling crazy when she is on it".  She admits to dizziness in the evenings and increased thirst. She drinks about 80-90oz of water a day. She has not started any new medications. She is on benazepril and triamterene/HCTZ. She is not a smoker.   .. Active Ambulatory Problems    Diagnosis Date Noted  . Hypertension 10/05/2012  . Hyperlipidemia 10/05/2012  . Asthma 10/05/2012  . GERD (gastroesophageal reflux disease) 10/05/2012  . History of colonic polyps 10/05/2012  . Right lower quadrant abdominal pain 08/10/2017  . Primary insomnia 08/10/2017  . Decreased hemoglobin 08/10/2017  . Constipation due to slow transit 08/17/2017  . Hyponatremia 09/09/2017  . 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 Hypotension due to drugs 09/12/2017   Resolved Ambulatory Problems    Diagnosis Date Noted  . No Resolved Ambulatory Problems   Past Medical History:  Diagnosis Date  . Asthma   . GERD (gastroesophageal reflux disease)   . Hyperlipidemia   . Hypertension      Review of Systems  Constitutional: Negative for appetite change, chills and diaphoresis.  HENT: Negative.   Cardiovascular: Negative for chest pain, palpitations and leg swelling.  Musculoskeletal: Negative.   Neurological: Positive for dizziness and light-headedness. Negative for weakness, numbness and headaches.  Psychiatric/Behavioral: Negative for confusion and  suicidal ideas.       Objective:   Physical Exam  Constitutional: She is oriented to person, place, and time. She appears well-developed and well-nourished.  HENT:  Head: Normocephalic and atraumatic.  Cardiovascular: Normal rate, regular rhythm and normal heart sounds.   Pulmonary/Chest: Effort normal and breath sounds normal. She has no wheezes.  Neurological: She is alert and oriented to person, place, and time.  Psychiatric: She has a normal mood and affect. Her behavior is normal.          Assessment & Plan:  Marland Kitchen.Marland Kitchen.Diagnoses and all orders for this visit:  Hyponatremia -     Osmolality, urine -     Sodium, urine, random  Chronic idiopathic constipation -     Plecanatide (TRULANCE) 3 MG TABS; Take 1 tablet by mouth daily.  Hypotension due to drugs   I want to further work up of hyponatermia with urine studies. BP is low today and could be causing dizziness. Stop triamtereine/HCTZ. Follow up in 2 weeks with BP check and then will check electrolytes. Perhaps this is a medication issue. She certainly does not have severe symptoms but we want to see this improve. If does not we need to get CXR and consider cortisol levels, ACTH for better evaluation and possible referral to endocrinology.   linzess was not covered. Will try trulance. miralax daily is not working well. I want to to have regular bowel movements because her constipation could be causing this RLQ pain. She was not able  to tolerate doxepin givne by GI it made her too "crazy in the head".   Marland Kitchen.Spent 30 minutes with patient and greater than 50 percent of visit spent counseling patient regarding treatment plan.

## 2017-09-10 LAB — SODIUM, URINE, RANDOM: SODIUM UR: 133 mmol/L (ref 28–272)

## 2017-09-11 LAB — OSMOLALITY, URINE: OSMOLALITY UR: 594 mosm/kg (ref 50–1200)

## 2017-09-12 DIAGNOSIS — I952 Hypotension due to drugs: Secondary | ICD-10-CM | POA: Insufficient documentation

## 2017-09-17 DIAGNOSIS — L570 Actinic keratosis: Secondary | ICD-10-CM | POA: Diagnosis not present

## 2017-09-17 DIAGNOSIS — L578 Other skin changes due to chronic exposure to nonionizing radiation: Secondary | ICD-10-CM | POA: Diagnosis not present

## 2017-10-22 DIAGNOSIS — K5900 Constipation, unspecified: Secondary | ICD-10-CM | POA: Diagnosis not present

## 2017-10-22 DIAGNOSIS — R1031 Right lower quadrant pain: Secondary | ICD-10-CM | POA: Diagnosis not present

## 2017-11-04 ENCOUNTER — Ambulatory Visit (INDEPENDENT_AMBULATORY_CARE_PROVIDER_SITE_OTHER): Payer: Medicare Other

## 2017-11-04 DIAGNOSIS — Z1231 Encounter for screening mammogram for malignant neoplasm of breast: Secondary | ICD-10-CM

## 2017-11-25 DIAGNOSIS — R7989 Other specified abnormal findings of blood chemistry: Secondary | ICD-10-CM | POA: Diagnosis not present

## 2017-11-25 DIAGNOSIS — R1031 Right lower quadrant pain: Secondary | ICD-10-CM | POA: Diagnosis not present

## 2017-11-25 DIAGNOSIS — K5900 Constipation, unspecified: Secondary | ICD-10-CM | POA: Diagnosis not present

## 2017-11-25 DIAGNOSIS — G4700 Insomnia, unspecified: Secondary | ICD-10-CM | POA: Diagnosis not present

## 2017-11-25 DIAGNOSIS — R1030 Lower abdominal pain, unspecified: Secondary | ICD-10-CM | POA: Diagnosis not present

## 2017-11-27 DIAGNOSIS — Z09 Encounter for follow-up examination after completed treatment for conditions other than malignant neoplasm: Secondary | ICD-10-CM | POA: Diagnosis not present

## 2017-11-27 DIAGNOSIS — Z96642 Presence of left artificial hip joint: Secondary | ICD-10-CM | POA: Diagnosis not present

## 2017-12-10 ENCOUNTER — Encounter: Payer: Self-pay | Admitting: Physician Assistant

## 2017-12-10 ENCOUNTER — Ambulatory Visit (INDEPENDENT_AMBULATORY_CARE_PROVIDER_SITE_OTHER): Payer: Medicare Other | Admitting: Physician Assistant

## 2017-12-10 VITALS — BP 133/86 | HR 67 | Temp 97.6°F | Wt 136.0 lb

## 2017-12-10 DIAGNOSIS — L309 Dermatitis, unspecified: Secondary | ICD-10-CM | POA: Diagnosis not present

## 2017-12-10 DIAGNOSIS — R300 Dysuria: Secondary | ICD-10-CM | POA: Diagnosis not present

## 2017-12-10 DIAGNOSIS — N9489 Other specified conditions associated with female genital organs and menstrual cycle: Secondary | ICD-10-CM | POA: Diagnosis not present

## 2017-12-10 DIAGNOSIS — K600 Acute anal fissure: Secondary | ICD-10-CM | POA: Insufficient documentation

## 2017-12-10 DIAGNOSIS — K581 Irritable bowel syndrome with constipation: Secondary | ICD-10-CM

## 2017-12-10 LAB — POCT URINALYSIS DIPSTICK
Bilirubin, UA: NEGATIVE
Glucose, UA: NEGATIVE
Ketones, UA: NEGATIVE
LEUKOCYTES UA: NEGATIVE
Nitrite, UA: NEGATIVE
PH UA: 7.5 (ref 5.0–8.0)
Protein, UA: NEGATIVE
RBC UA: NEGATIVE
Spec Grav, UA: 1.015 (ref 1.010–1.025)
Urobilinogen, UA: 0.2 E.U./dL

## 2017-12-10 MED ORDER — NITROGLYCERIN 0.4 % RE OINT
1.0000 [in_us] | TOPICAL_OINTMENT | Freq: Two times a day (BID) | RECTAL | 3 refills | Status: DC | PRN
Start: 2017-12-10 — End: 2017-12-11

## 2017-12-10 MED ORDER — CEPHALEXIN 500 MG PO CAPS: 500.0000 mg | ORAL_CAPSULE | Freq: Two times a day (BID) | ORAL | 0 refills | Status: DC

## 2017-12-10 MED ORDER — LUBIPROSTONE 8 MCG PO CAPS: 8.0000 ug | ORAL_CAPSULE | Freq: Two times a day (BID) | ORAL | 0 refills | Status: DC

## 2017-12-10 MED ORDER — FLUCONAZOLE 150 MG PO TABS: ORAL_TABLET | ORAL | 0 refills | Status: DC

## 2017-12-10 NOTE — Progress Notes (Signed)
HPI:                                                                Judy [name] is a 72 y.o. female [facility] today for dysuria and rectal pain  Dysuria   This is a new problem. The current episode started yesterday. The problem occurs every urination. The quality of the pain is described as burning. The pain is moderate. There has been no fever. She is not sexually active. There is a history of pyelonephritis. Associated symptoms include chills and hesitancy. Pertinent negatives include no flank pain, frequency, hematuria, possible pregnancy or urgency. She has tried nothing for the symptoms. There is no history of recurrent UTIs.  Constipation  This is a recurrent problem. The current episode started more than 1 year ago. The problem is unchanged. Her stool frequency is 4 to 5 times per week. The stool is described as pencil thin. The patient is on a high fiber diet. There has been adequate water intake. Associated symptoms include back pain and rectal pain (x 3 days). Pertinent negatives include no abdominal pain, diarrhea, fecal incontinence, fever, hematochezia or melena. She has tried diet changes, stool softeners, laxatives and fiber (Linzess) for the symptoms. The treatment provided mild relief. Her past medical history is significant for irritable bowel syndrome.    Dep[facility] screen [facility] 08/07/2017  Decreased Interest 0  Down, Depressed, Hopeless 0  PHQ - 2 Score 0    No flowsheet data found.    Past Medical History:  Diagnosis Date  . Asthma   . GERD (gastroesophageal reflux disease)   . Hyperlipidemia   . Hypertension    Past Surgical History:  Procedure Laterality Date  . APPENDECTOMY    . JOINT REPLACEMENT  2005   Left Hip  . RHINOPLASTY     30 years ago   Social History   Tobacco Use  . Smoking status: Never Smoker  . Smokeless tobacco: Never Used  Substance Use Topics  . Alcohol use: Yes     Alcohol/week: 1.8 oz    Types: 3 Glasses of wine per week   family history includes Breast cancer (age of onset: 1460) in her maternal aunt; Cancer in her maternal aunt; Diabetes in her maternal grandmother and sister; Hypertension in her maternal grandmother and mother; Stroke in her maternal grandmother and mother.    ROS: negative except as noted in the HPI  Medications: Current Outpatient Medications  Medication Sig Dispense Refill  . atorvastatin (LIPITOR) 20 MG tablet TAKE ONE TABLET BY MOUTH DAILY 90 tablet 0  . benazepril (LOTENSIN) 20 MG tablet Take 1 tablet (20 mg total) by mouth daily. 90 tablet 5  . montelukast (SINGULAIR) 10 MG tablet TAKE ONE TABLET BY MOUTH DAILY AT BEDTIME 90 tablet 0  . Multiple Vitamins-Minerals (MULTIVITAMIN WITH MINERALS) tablet Take 1 tablet by mouth daily.    . naproxen sodium (ANAPROX) 220 MG tablet Take 220 mg by mouth 1 day or 1 dose.    . polyethylene glycol (MIRALAX / GLYCOLAX) packet Take 17 g by mouth 2 (two) times daily. Until stooling regularly 30 packet 3  . temazepam (RESTORIL) 15 MG capsule TK ONE C PO HS  4  .  cephALEXin (KEFLEX) 500 MG capsule Take 1 capsule (500 mg total) by mouth 2 (two) times daily. 14 capsule 0  . fluconazole (DIFLUCAN) 150 MG tablet 1 tab PO once. Repeat in 3 days if symptoms persist 2 tablet 0  . lubiprostone (AMITIZA) 8 MCG capsule Take 1 capsule (8 mcg total) by mouth 2 (two) times daily with a meal. 60 capsule 0  . Nitroglycerin 0.4 % OINT Place 1 inch rectally every 12 (twelve) hours as needed (for pain, use up to 3 weeks.). 30 g 3   No current facility-administered medications for this visit.    Allergies  Allergen Reactions  . Latex        Objective:  BP 133/86   Pulse 67   Temp 97.6 F (36.4 C) (Oral)   Wt 136 lb (61.7 kg)   SpO2 100%   BMI 21.30 kg/m  Gen:  alert, not ill-appearing, no distress, appropriate for age HEENT: head normocephalic without obvious abnormality, conjunctiva and  cornea clear, trachea midline Pulm: Normal work of breathing, normal phonation GI: abdomen soft, there is generalized lower abdomen pain, no rebound or guarding, no CVA tenderness GU: left labia majora erythematous and swollen with a 3mm ulceration with serous drainage, introitus with atrophic changes, urethral meatus normal, vaginal mucosa without erythema, no visible discharge; tender sentinal node skin tag at the posterior anus Neuro: alert and oriented x 3, no tremor MSK: extremities atraumatic, normal gait and station Skin: intact, no rashes on exposed skin, no jaundice, no cyanosis Psych: well-groomed, cooperative, good eye contact, euthymic mood, affect mood-congruent, speech is articulate, and thought processes clear and goal-directed  A chaperone was used for the GU portion of the exam, Sunnie Nielsen, DO.  Results for orders placed or performed in visit on 12/10/17 (from the past 72 hour(s))  POCT Urinalysis Dipstick     Status: Normal   Collection Time: 12/10/17 11:39 AM  Result Value Ref Range   Color, UA yellow    Clarity, UA clear    Glucose, UA negative    Bilirubin, UA negative    Ketones, UA negative    Spec Grav, UA 1.015 1.010 - 1.025   Blood, UA negative    pH, UA 7.5 5.0 - 8.0   Protein, UA negaitve    Urobilinogen, UA 0.2 0.2 or 1.0 E.U./dL   Nitrite, UA negative    Leukocytes, UA Negative Negative   Appearance     Odor     No results found.    Assessment and Plan: 72 y.o. female with  1. Dysuria - POCT Urinalysis Dipstick negative - Urine Culture pending - suspect burning is secondary to the vulvar irritation  2. Irritable bowel syndrome with constipation - stop Miralax. She had severe diarrhea with Linzess. She never filled Trulance. Will trial low-dose Amitiza - lubiprostone (AMITIZA) 8 MCG capsule; Take 1 capsule (8 mcg total) by mouth 2 (two) times daily with a meal.  Dispense: 60 capsule; Refill: 0  3. Acute anterior anal fissure -  treating constipation - Nitroglycerin 0.4 % OINT; Place 1 inch rectally every 12 (twelve) hours as needed (for pain, use up to 3 weeks.).  Dispense: 30 g; Refill: 3  4. Labial swelling - differential is vulvar irritant dermatitis versus infection. It does not appear herpetic. Viral culture pending. Patient does report she is fairly aggressive with hygiene measures. Instructed to avoid soaps/cleansers and cleanse area gently with fingertips and warm water only. She will also avoid baby wipes and aggressive wiping.  Recommend barrier cream to prevent irritation when voiding - Viral culture pending - fluconazole (DIFLUCAN) 150 MG tablet; 1 tab PO once. Repeat in 3 days if symptoms persist  Dispense: 2 tablet; Refill: 0 - cephALEXin (KEFLEX) 500 MG capsule; Take 1 capsule (500 mg total) by mouth 2 (two) times daily.  Dispense: 14 capsule; Refill: 0   Patient education and anticipatory guidance given Patient agrees with treatment plan Follow-up in 1 week or sooner as needed if symptoms worsen or fail to improve  Levonne Hubert PA-C

## 2017-12-10 NOTE — Patient Instructions (Signed)
Anal Fissure, Adult An anal fissure is a small tear or crack in the skin around the anus. Bleeding from a fissure usually stops on its own within a few minutes. However, bleeding will often occur again with each bowel movement until the crack heals. What are the causes? This condition may be caused by:  Passing large, hard stool (feces).  Frequent diarrhea.  Constipation.  Inflammatory bowel disease (Crohn disease or ulcerative colitis).  Infections.  Anal sex.  What are the signs or symptoms? Symptoms of this condition include:  Bleeding from the rectum.  Small amounts of blood seen on your stool, on toilet paper, or in the toilet after a bowel movement.  Painful bowel movements.  Itching or irritation around the anus.  How is this diagnosed? A health care provider may diagnose this condition by closely examining the anal area. An anal fissure can usually be seen with careful inspection. In some cases, a rectal exam may be performed, or a short tube (anoscope) may be used to examine the anal canal. How is this treated? Treatment for this condition may include:  Taking steps to avoid constipation. This may include making changes to your diet, such as increasing your intake of fiber or fluid.  Taking fiber supplements. These supplements can soften your stool to help make bowel movements easier. Your health care provider may also prescribe a stool softener if your stool is often hard.  Taking sitz baths. This may help to heal the tear.  Using medicated creams or ointments. These may be prescribed to lessen discomfort.  Follow these instructions at home: Eating and drinking  Avoid foods that may be constipating, such as bananas and dairy products.  Drink enough fluid to keep your urine clear or pale yellow.  Maintain a diet that is high in fruits, whole grains, and vegetables. General instructions  Keep the anal area as clean and dry as possible.  Take sitz baths as  told by your health care provider. Do not use soap in the sitz baths.  Take over-the-counter and prescription medicines only as told by your health care provider.  Use creams or ointments only as told by your health care provider.  Keep all follow-up visits as told by your health care provider. This is important. Contact a health care provider if:  You have more bleeding.  You have a fever.  You have diarrhea that is mixed with blood.  You continue to have pain.  Your problem is getting worse rather than better. This information is not intended to replace advice given to you by your health care provider. Make sure you discuss any questions you have with your health care provider. Document Released: 11/03/2005 Document Revised: 03/12/2016 Document Reviewed: 01/29/2015 Elsevier Interactive Patient Education  2018 [employer]   Vulvar Pain Vulvar pain is long-lasting (chronic) pain in the external female genitalia (vulva). This condition may also be called vulvodynia. The vulva includes tissues surrounding the vaginal opening and the urethra. Vulvar pain often has no known cause. There are two main types of vulvar pain:  Localized vulvar pain. This is when pain affects a specific area of the vulva. ? Vulvar vestibulitis syndrome (VVS) is a type of localized vulvar pain in which pain is only felt around the opening (vestibule) of the vagina.  Generalized vulvar pain. This is when pain affects: ? The entire vulva. ? Multiple areas of the vulva. ? One side of the vulva.  What are the causes? The cause of vulvar pain  is often not known, and many factors may contribute to it. It may be a type of nerve pain (neuropathic pain). What increases the risk? Women who have any of the following conditions may be more likely to develop vulvar pain:  Pelvic floor dysfunction.  A disorder that affects a protein (interleukin 1 beta receptor antagonist) involved in inflammation in the  body.  An increased number of pain receptor nerves throughout the body.  Painful bladder.  Irritable bowel syndrome (IBS).  Fibromyalgia.  Restless sleep.  Depression.  Certain pain conditions.  What are the signs or symptoms? The main symptom of this condition is pain that affects part of the vulva or the entire vulva. Pain may:  Feel like a burning, tearing, stinging, or sharp, knife-like sensation.  Be chronic and ongoing.  Occur off and on (intermittently).  Get worse when touching or putting pressure on the vulva, such as with tampon insertion, sexual intercourse, sitting for a long time, or wearing tight pants.  Symptoms may also include:  Swelling or redness of the vulva, perineum, or inner thighs.  Psychological or emotional distress.  How is this diagnosed? This condition may be diagnosed based on:  Your symptoms and your medical history. Your health care provider may ask questions about what causes or worsens your pain.  A physical exam of your abdomen and pelvis. This may include a cotton swab test, in which your health care provider gently touches all areas of your vulva with a cotton swab to determine where you have pain.  Tests of your vaginal fluid. These tests may be done to check for infection.  How is this treated? Treatment for this condition depends on the cause and severity of your pain. Treatment may include:  Caring for your skin.  Working with a health care provider who specializes in pain.  Physical therapy.  Counseling (psychotherapy). This may include: ? Biofeedback. This is a type of therapy that helps you be more aware of your body's response to pain. It also helps you learn to deal with pain.  Alternative medical therapies to help relieve pain, such as: ? Hypnotherapy. This means being put in a state of altered consciousness (hypnosis). ? Acupuncture. This is the insertion of needles into certain places on the skin.  Surgery to  remove affected parts of your vulva.  Follow these instructions at home: Clothing  Wear cotton underwear.  Avoid wearing tight pants.  Wash clothing in non-scented, mild laundry detergent. Do not use fabric softeners or dryer sheets. Eating and drinking  Avoid caffeine.  Avoid foods that are high in sugar or highly processed. General instructions   Take over-the-counter and prescription medicines only as told by your health care provider.  If any soaps, lotions, creams, or ointments cause or worsen your pain, do not use these products on the affected areas.  Do not use feminine wipes or douches.  Do not use scented body soaps, scented pads, or scented tampons.  Clean your vulvar skin with warm water only and pat the area dry.  Consider using a non-scented silicone-based or oil-based lubricant during sexual intercourse.  Keep all follow-up visits as told by your health care provider. This is important. Contact a health care provider if:  Your pain gets worse or does not improve after starting treatment.  You develop vaginal discharge that smells bad. This information is not intended to replace advice given to you by your health care provider. Make sure you discuss any questions you have with  your health care provider. Document Released: 02/25/2016 Document Revised: 04/10/2016 Document Reviewed: 10/11/2014 Elsevier Interactive Patient Education  [employer].

## 2017-12-11 ENCOUNTER — Other Ambulatory Visit: Payer: Self-pay | Admitting: Physician Assistant

## 2017-12-11 ENCOUNTER — Telehealth: Payer: Self-pay

## 2017-12-11 LAB — URINE CULTURE
MICRO NUMBER:: 90103364
Result:: NO GROWTH
SPECIMEN QUALITY:: ADEQUATE

## 2017-12-11 MED ORDER — PRAMOXINE HCL 1 % RE FOAM: 1.0000 "application " | Freq: Three times a day (TID) | RECTAL | 0 refills | Status: DC | PRN

## 2017-12-11 NOTE — Telephone Encounter (Signed)
Spoke pharmacy.  They stated that nitroglycerin only comes in a 30 g tube brand name.  This is something they will have to order from their warehouse because they do not keep it on hand.  Notified patient that if she wanted this medication she would have to notify pharmacy so that they can order it for her or we can send it to a compound pharmacy.  Pt decided to keep original Rx and have it filled with pharmacy on file. -EH/RMA

## 2017-12-11 NOTE — Telephone Encounter (Signed)
That's fine. Okay to contact pharmacy for tube size and re-send

## 2017-12-11 NOTE — Telephone Encounter (Signed)
Pt reports that the nitroglycerin cream is too costly for the 30 g tube.  She stated that the pharmacy said the the smaller tube would be cheaper. Please advise. -EH/RMA

## 2017-12-12 ENCOUNTER — Encounter: Payer: Self-pay | Admitting: Emergency Medicine

## 2017-12-12 ENCOUNTER — Emergency Department (INDEPENDENT_AMBULATORY_CARE_PROVIDER_SITE_OTHER)
Admission: EM | Admit: 2017-12-12 | Discharge: 2017-12-12 | Disposition: A | Discharge status: Home / Self Care | Payer: Medicare Other | Source: Home / Self Care | Attending: Family Medicine | Admitting: Family Medicine

## 2017-12-12 DIAGNOSIS — N760 Acute vaginitis: Secondary | ICD-10-CM

## 2017-12-12 DIAGNOSIS — R21 Rash and other nonspecific skin eruption: Secondary | ICD-10-CM | POA: Diagnosis not present

## 2017-12-12 MED ORDER — TRIAMCINOLONE ACETONIDE 0.1 % EX CREA: 1.0000 "application " | TOPICAL_CREAM | Freq: Two times a day (BID) | CUTANEOUS | 0 refills | Status: DC

## 2017-12-12 NOTE — Discharge Instructions (Signed)
°  For your pain with urinating and vaginal itching/burning, you may try a few over the counter treatments.  You may try over the counter medication called Azo to help with bladder spasms and urinary discomfort.  This medication can make your urine orange, which is normal.   For the vaginal irritation, you may try preparation H, witch hazel wipes creams or pads, and/or a diaper rash cream such as Desitin or A&D ointment to act as a skin barrier so your urine is not irritating the skin sore.   If you have questions, you may always ask the pharmacist for assistance.   Please be sure to keep taking the medications [facility] the other day as the rash on your arm does not appear to be an allergic reaction from the medication.   Rashes due to drug allergies are typically spread all over the body in something we call hives, small to large red itching patches.

## 2017-12-12 NOTE — ED Triage Notes (Signed)
Patient complaining of possible allergic reaction to Keflex.  Placed on Keflex 12/11/17, unsure if coming from antbs or possible shingles.  Per on-call nurse advised to come here.

## 2017-12-12 NOTE — ED Provider Notes (Signed)
[name]    CSN: [id number] Arrival date & time: 12/12/17  1716     History   Chief Complaint Chief Complaint  Patient presents with  . Rash    HPI Judy [name] is a 72 y.o. female.   HPI Judy [name] is a 72 y.o. female presenting to UC with c/o minimally itchy red rash on Left forearm that she noticed earlier today.  She has tried OTC hydrocortisone cream with mild relief.  She was started on Keflex and Diflucan on 12/11/17 due to urinary symptoms and a vaginal sore.  A viral swab was taken at that time but her urine culture came back normal.  She is allergic to latex but no other known allergies.  She does not recall new soaps or lotions. She has not been around others with a rash and has not been outside or around firewood or vines that may have caused the rash.  The on-call nurse encouraged her to come be evaluated for possible allergic reaction or shingles.     Past Medical History:  Diagnosis Date  . Asthma   . GERD (gastroesophageal reflux disease)   . Hyperlipidemia   . Hypertension     Patient Active Problem List   Diagnosis Date Noted  . Acute anterior anal fissure 12/10/2017  . Labial swelling 12/10/2017  . Irritable bowel syndrome with constipation 12/10/2017  . Dysuria 12/10/2017  . Hypotension due to drugs 09/12/2017  . Hyponatremia 09/09/2017  . 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 Constipation due to slow transit 08/17/2017  . Right lower quadrant abdominal pain 08/10/2017  . Primary insomnia 08/10/2017  . Decreased hemoglobin 08/10/2017  . Hypertension 10/05/2012  . Hyperlipidemia 10/05/2012  . Asthma 10/05/2012  . GERD (gastroesophageal reflux disease) 10/05/2012  . History of colonic polyps 10/05/2012    Past Surgical History:  Procedure Laterality Date  . APPENDECTOMY    . JOINT REPLACEMENT  2005   Left Hip  . RHINOPLASTY     30 years ago    OB History    Gravida Para Term Preterm AB Living   0 0 0 0 0 0   SAB  TAB Ectopic Multiple Live Births   0 0 0 0 0       Home Medications    Prior to Admission medications   Medication Sig Start Date End Date Taking? Authorizing Provider  atorvastatin (LIPITOR) 20 MG tablet TAKE ONE TABLET BY MOUTH DAILY 10/14/13   Gordy Savers, MD  benazepril (LOTENSIN) 20 MG tablet Take 1 tablet (20 mg total) by mouth daily. 10/05/12   Gordy Savers, MD  cephALEXin (KEFLEX) 500 MG capsule Take 1 capsule (500 mg total) by mouth 2 (two) times daily. 12/10/17   Carlis Stable, PA-C  fluconazole (DIFLUCAN) 150 MG tablet 1 tab PO once. Repeat in 3 days if symptoms persist 12/10/17   Carlis Stable, PA-C  lubiprostone [facility]) 8 MCG capsule Take 1 capsule (8 mcg total) by mouth 2 (two) times daily with a meal. 12/10/17   Carlis Stable, PA-C  montelukast (SINGULAIR) 10 MG tablet TAKE ONE TABLET BY MOUTH DAILY AT BEDTIME 10/14/13   Gordy Savers, MD  Multiple Vitamins-Minerals (MULTIVITAMIN WITH MINERALS) tablet Take 1 tablet by mouth daily.    [provider]  naproxen sodium (ANAPROX) 220 MG tablet Take 220 mg by mouth 1 day or 1 dose.    [provider]  pramoxine (PROCTOFOAM) 1 % foam Place 1 application rectally  3 (three) times daily as needed for anal irritation. 12/11/17   Carlis Stable, PA-C  temazepam (RESTORIL) 15 MG capsule TK ONE C PO HS 07/19/17   [provider]  triamcinolone cream (KENALOG) 0.1 % Apply 1 application topically 2 (two) times daily. To left forearm 12/12/17   Lurene Shadow, PA-C    Family History Family History  Problem Relation Age of Onset  . Diabetes Maternal Grandmother   . Hypertension Maternal Grandmother   . Stroke Maternal Grandmother   . Hypertension Mother   . Stroke Mother   . Diabetes Sister   . Cancer Maternal Aunt   . Breast cancer Maternal Aunt 60  . Ovarian cancer Neg Hx     Social History Social History   Tobacco Use  .  Smoking status: Never Smoker  . Smokeless tobacco: Never Used  Substance Use Topics  . Alcohol use: Yes    Alcohol/week: 1.8 oz    Types: 3 Glasses of wine per week  . Drug use: No     Allergies   Latex   Review of Systems Review of Systems  Constitutional: Negative for chills and fever.  HENT: Negative for sore throat and trouble swallowing.   Respiratory: Negative for shortness of breath, wheezing and stridor.   Musculoskeletal: Negative for arthralgias, joint swelling and myalgias.  Skin: Positive for rash. Negative for wound.     Physical Exam Triage Vital Signs ED Triage Vitals  Enc Vitals Group     BP 12/12/17 1729 (!) 160/85     Pulse Rate 12/12/17 1729 76     Resp --      Temp 12/12/17 1729 97.8 F (36.6 C)     Temp Source 12/12/17 1729 Oral     SpO2 12/12/17 1729 100 %     Weight 12/12/17 1730 136 lb 4 oz (61.8 kg)     Height 12/12/17 1730 5\' 7"  (1.702 m)     Head Circumference --      Peak Flow --      Pain Score --      Pain Loc --      Pain Edu? --      Excl. in GC? --    No data found.  Updated Vital Signs BP (!) 160/85 (BP Location: Right Arm)   Pulse 76   Temp 97.8 F (36.6 C) (Oral)   Ht 5\' 7"  (1.702 m)   Wt 136 lb 4 oz (61.8 kg)   SpO2 100%   BMI 21.34 kg/m   Visual Acuity Right Eye Distance:   Left Eye Distance:   Bilateral Distance:    Right Eye Near:   Left Eye Near:    Bilateral Near:     Physical Exam  Constitutional: She is oriented to person, place, and time. She appears well-developed and well-nourished. No distress.  HENT:  Head: Normocephalic and atraumatic.  Mouth/Throat: Oropharynx is clear and moist.  Eyes: EOM are normal.  Neck: Normal range of motion.  Cardiovascular: Normal rate and regular rhythm.  Pulmonary/Chest: Effort normal. No respiratory distress.  Musculoskeletal: Normal range of motion.  Neurological: She is alert and oriented to person, place, and time.  Skin: Skin is warm and dry. Rash noted. She  is not diaphoretic. There is erythema.     Left forearm: faint erythematous rash with 2 papular lesions and one 1cm linear lesion. No blistering. Rash does blanch. Non-tender. No bleeding or discharge.  No other rashes  Psychiatric: She has a  normal mood and affect. Her behavior is normal.  Nursing note and vitals reviewed.    UC Treatments / Results  Labs (all labs ordered are listed, but only abnormal results are displayed) Labs Reviewed - No data to display  EKG  EKG Interpretation None       Radiology No results found.  Procedures Procedures (including critical care time)  Medications Ordered in UC Medications - No data to display   Initial Impression / Assessment and Plan / UC Course  I have reviewed the triage vital signs and the nursing notes.  Pertinent labs & imaging results that were available during my care of the patient were reviewed by me and considered in my medical decision making (see chart for details).     Rash appears most c/w a contact dermatitis type distribution.  Appearance is atypical for shingles or a systemic allergic reaction. Will have pt try Triamcinolone cream on rash and continue taking medications as prescribed by her PCP Discussed OTC options to help with urinary/vaginal discomfort she is currently being treated for. Encouraged f/u with PCP later this week for recheck of symptoms Home care instructions provided.   Final Clinical Impressions(s) / UC Diagnoses   Final diagnoses:  Rash and nonspecific skin eruption  Acute vaginitis    ED Discharge Orders        Ordered    triamcinolone cream (KENALOG) 0.1 %  2 times daily     12/12/17 1745       Controlled Substance Prescriptions [place] Controlled Substance Registry consulted? Not Applicable   Rolla Platehelps, Suad Autrey O, PA-C 12/13/17 1158

## 2017-12-14 ENCOUNTER — Ambulatory Visit: Payer: Medicare Other | Admitting: Osteopathic Medicine

## 2017-12-14 DIAGNOSIS — R4781 Slurred speech: Secondary | ICD-10-CM | POA: Diagnosis not present

## 2017-12-14 DIAGNOSIS — E785 Hyperlipidemia, unspecified: Secondary | ICD-10-CM | POA: Diagnosis not present

## 2017-12-14 DIAGNOSIS — R531 Weakness: Secondary | ICD-10-CM | POA: Diagnosis not present

## 2017-12-14 DIAGNOSIS — I10 Essential (primary) hypertension: Secondary | ICD-10-CM | POA: Diagnosis not present

## 2017-12-14 DIAGNOSIS — N898 Other specified noninflammatory disorders of vagina: Secondary | ICD-10-CM | POA: Diagnosis not present

## 2017-12-14 DIAGNOSIS — R202 Paresthesia of skin: Secondary | ICD-10-CM | POA: Diagnosis not present

## 2017-12-14 DIAGNOSIS — G9341 Metabolic encephalopathy: Secondary | ICD-10-CM | POA: Diagnosis not present

## 2017-12-14 DIAGNOSIS — R29818 Other symptoms and signs involving the nervous system: Secondary | ICD-10-CM | POA: Diagnosis not present

## 2017-12-14 DIAGNOSIS — R112 Nausea with vomiting, unspecified: Secondary | ICD-10-CM | POA: Diagnosis not present

## 2017-12-14 DIAGNOSIS — K581 Irritable bowel syndrome with constipation: Secondary | ICD-10-CM | POA: Diagnosis not present

## 2017-12-14 DIAGNOSIS — E876 Hypokalemia: Secondary | ICD-10-CM | POA: Diagnosis not present

## 2017-12-14 DIAGNOSIS — Z96642 Presence of left artificial hip joint: Secondary | ICD-10-CM | POA: Diagnosis not present

## 2017-12-14 DIAGNOSIS — J4520 Mild intermittent asthma, uncomplicated: Secondary | ICD-10-CM | POA: Diagnosis not present

## 2017-12-14 DIAGNOSIS — E860 Dehydration: Secondary | ICD-10-CM | POA: Diagnosis not present

## 2017-12-14 DIAGNOSIS — R200 Anesthesia of skin: Secondary | ICD-10-CM | POA: Diagnosis not present

## 2017-12-14 DIAGNOSIS — R1110 Vomiting, unspecified: Secondary | ICD-10-CM | POA: Diagnosis not present

## 2017-12-14 DIAGNOSIS — R4701 Aphasia: Secondary | ICD-10-CM | POA: Diagnosis not present

## 2017-12-14 DIAGNOSIS — K5900 Constipation, unspecified: Secondary | ICD-10-CM | POA: Diagnosis not present

## 2017-12-14 DIAGNOSIS — J329 Chronic sinusitis, unspecified: Secondary | ICD-10-CM | POA: Diagnosis not present

## 2017-12-14 DIAGNOSIS — E871 Hypo-osmolality and hyponatremia: Secondary | ICD-10-CM | POA: Diagnosis not present

## 2017-12-14 DIAGNOSIS — Z0189 Encounter for other specified special examinations: Secondary | ICD-10-CM

## 2017-12-16 MED ORDER — ENOXAPARIN SODIUM 40 MG/0.4ML ~~LOC~~ SOLN
40.00 | SUBCUTANEOUS | Status: DC
Start: 2017-12-18 — End: 2017-12-16

## 2017-12-16 MED ORDER — LISINOPRIL 20 MG PO TABS
20.00 | ORAL_TABLET | ORAL | Status: DC
Start: 2017-12-18 — End: 2017-12-16

## 2017-12-16 MED ORDER — SENNA-DOCUSATE SODIUM 8.6-50 MG PO TABS
ORAL_TABLET | ORAL | Status: DC
Start: 2017-12-18 — End: 2017-12-16

## 2017-12-16 MED ORDER — ALBUTEROL SULFATE (2.5 MG/3ML) 0.083% IN NEBU
2.50 | INHALATION_SOLUTION | RESPIRATORY_TRACT | Status: DC
Start: ? — End: 2017-12-16

## 2017-12-16 MED ORDER — ATORVASTATIN CALCIUM 20 MG PO TABS
20.00 | ORAL_TABLET | ORAL | Status: DC
Start: 2017-12-17 — End: 2017-12-16

## 2017-12-16 MED ORDER — POLYETHYLENE GLYCOL 3350 17 G PO PACK
17.00 | PACK | ORAL | Status: DC
Start: 2017-12-18 — End: 2017-12-16

## 2017-12-16 MED ORDER — GENERIC EXTERNAL MEDICATION
Status: DC
Start: ? — End: 2017-12-16

## 2017-12-16 MED ORDER — NITROGLYCERIN 0.4 MG SL SUBL
.40 | SUBLINGUAL_TABLET | SUBLINGUAL | Status: DC
Start: ? — End: 2017-12-16

## 2017-12-16 MED ORDER — ACETAMINOPHEN 325 MG PO TABS
650.00 | ORAL_TABLET | ORAL | Status: DC
Start: ? — End: 2017-12-16

## 2017-12-16 MED ORDER — ANTACID & ANTIGAS 200-200-20 MG/5ML PO SUSP
30.00 | ORAL | Status: DC
Start: ? — End: 2017-12-16

## 2017-12-16 MED ORDER — POLYETHYLENE GLYCOL 3350 17 G PO PACK
17.00 | PACK | ORAL | Status: DC
Start: ? — End: 2017-12-16

## 2017-12-16 MED ORDER — GUAIFENESIN 100 MG/5ML PO LIQD
200.00 | ORAL | Status: DC
Start: ? — End: 2017-12-16

## 2017-12-16 MED ORDER — MONTELUKAST SODIUM 10 MG PO TABS
10.00 | ORAL_TABLET | ORAL | Status: DC
Start: 2017-12-17 — End: 2017-12-16

## 2017-12-16 MED ORDER — SODIUM CHLORIDE 0.9 % IV SOLN
INTRAVENOUS | Status: DC
Start: ? — End: 2017-12-16

## 2017-12-16 MED ORDER — HYDRALAZINE HCL 20 MG/ML IJ SOLN
10.00 | INTRAMUSCULAR | Status: DC
Start: ? — End: 2017-12-16

## 2017-12-17 ENCOUNTER — Ambulatory Visit: Payer: Medicare Other | Admitting: Osteopathic Medicine

## 2017-12-19 MED ORDER — GENERIC EXTERNAL MEDICATION
Status: DC
Start: ? — End: 2017-12-19

## 2017-12-22 ENCOUNTER — Encounter: Payer: Self-pay | Admitting: Osteopathic Medicine

## 2017-12-22 ENCOUNTER — Ambulatory Visit (INDEPENDENT_AMBULATORY_CARE_PROVIDER_SITE_OTHER): Payer: Medicare Other | Admitting: Osteopathic Medicine

## 2017-12-22 VITALS — BP 172/96 | HR 77 | Temp 97.4°F | Wt 134.1 lb

## 2017-12-22 DIAGNOSIS — E871 Hypo-osmolality and hyponatremia: Secondary | ICD-10-CM

## 2017-12-22 DIAGNOSIS — I10 Essential (primary) hypertension: Secondary | ICD-10-CM

## 2017-12-22 DIAGNOSIS — N952 Postmenopausal atrophic vaginitis: Secondary | ICD-10-CM | POA: Diagnosis not present

## 2017-12-22 DIAGNOSIS — N9489 Other specified conditions associated with female genital organs and menstrual cycle: Secondary | ICD-10-CM

## 2017-12-22 DIAGNOSIS — R7989 Other specified abnormal findings of blood chemistry: Secondary | ICD-10-CM | POA: Diagnosis not present

## 2017-12-22 LAB — POCT URINALYSIS DIPSTICK
BILIRUBIN UA: NEGATIVE
Glucose, UA: NEGATIVE
KETONES UA: NEGATIVE
Leukocytes, UA: NEGATIVE
Nitrite, UA: NEGATIVE
Protein, UA: NEGATIVE
RBC UA: NEGATIVE
Spec Grav, UA: 1.01 (ref 1.010–1.025)
UROBILINOGEN UA: 0.2 U/dL
pH, UA: 7 (ref 5.0–8.0)

## 2017-12-22 MED ORDER — ESTRADIOL 0.1 MG/GM VA CREA: 1.0000 | TOPICAL_CREAM | Freq: Every day | VAGINAL | 12 refills | Status: AC

## 2017-12-22 MED ORDER — BENAZEPRIL HCL 20 MG PO TABS: 20.0000 mg | ORAL_TABLET | Freq: Two times a day (BID) | ORAL | 1 refills | Status: AC

## 2017-12-22 MED ORDER — FLUCONAZOLE 150 MG PO TABS: ORAL_TABLET | ORAL | 0 refills | Status: DC

## 2017-12-22 NOTE — Progress Notes (Signed)
HPI: Judy [name] is a 72 y.o. female who  has a past medical history of Asthma, GERD (gastroesophageal reflux disease), Hyperlipidemia, and Hypertension.  she presents to [facility] today, 12/22/17,  for chief complaint of: Establish care Hospital follow-up Rash recheck   Patient has been seen by few of the PAs that our practice, would like to establish with a physician. I saw her when she last was here 12/10/2017 as a second opinion on a vaginal rash she was having. At that time, left labia majora was erythematous, swollen, 3 mm ulceration with some serous drainage. Otherwise vaginal atrophy without any other significant findings. He was also having some dysuria at the time. We got a viral culture of the ulcerated area. Tried treatment for possible yeast infection/cellulitis with Diflucan and Keflex. Viral culture eventually turned up negative. UCx negative.    Keflex caused significant vomiting and diarrhea, patient believes this is what led to the sodium deficit, she has been on anti-HTN regimen with diuretics for many years without issue.   2 days later, patient went to urgent care 12/12/2017 for rash on left forearm 1 day. Over-the-counter hydrocortisone cream was helpful. Was diagnosed as most likely contact dermatitis, trial triamcinolone cream, otherwise continue medications as noted above for other issues.  2 days after that, 12/06/2017, went to the ER for complaints of mental status change, nausea, decreased oral intake, has been was concerned she might be having a stroke. Sodium 112, potassium 3, head CT was normal, EKG normal with QT interval 437. She was admitted to the hospital for 3 days for hyponatremia, acute metabolic encephalopathy, hypertension, hypokalemia. Hyponatremia felt to be due to dehydration, patient responded well to IV fluids. Discontinued Triamterene_HCT but Riley Nearing was not on her med list here? Encephalopathy resolved on correction of  hyponatremia.     Past medical, surgical, social and family history reviewed:  Patient Active Problem List   Diagnosis Date Noted  . Acute anterior anal fissure 12/10/2017  . Labial swelling 12/10/2017  . Irritable bowel syndrome with constipation 12/10/2017  . Dysuria 12/10/2017  . Hypotension due to drugs 09/12/2017  . Hyponatremia 09/09/2017  . 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 Constipation due to slow transit 08/17/2017  . Right lower quadrant abdominal pain 08/10/2017  . Primary insomnia 08/10/2017  . Decreased hemoglobin 08/10/2017  . Hypertension 10/05/2012  . Hyperlipidemia 10/05/2012  . Asthma 10/05/2012  . GERD (gastroesophageal reflux disease) 10/05/2012  . History of colonic polyps 10/05/2012    Past Surgical History:  Procedure Laterality Date  . APPENDECTOMY    . JOINT REPLACEMENT  2005   Left Hip  . RHINOPLASTY     30 years ago    Social History   Tobacco Use  . Smoking status: Never Smoker  . Smokeless tobacco: Never Used  Substance Use Topics  . Alcohol use: Yes    Alcohol/week: 1.8 oz    Types: 3 Glasses of wine per week    Family History  Problem Relation Age of Onset  . Diabetes Maternal Grandmother   . Hypertension Maternal Grandmother   . Stroke Maternal Grandmother   . Hypertension Mother   . Stroke Mother   . Diabetes Sister   . Cancer Maternal Aunt   . Breast cancer Maternal Aunt 60  . Ovarian cancer Neg Hx      Current medication list and allergy/intolerance information reviewed:    Current Outpatient Medications  Medication Sig Dispense Refill  . atorvastatin (LIPITOR) 20  MG tablet TAKE ONE TABLET BY MOUTH DAILY 90 tablet 0  . benazepril (LOTENSIN) 20 MG tablet Take 1 tablet (20 mg total) by mouth daily. 90 tablet 5  . cephALEXin (KEFLEX) 500 MG capsule Take 1 capsule (500 mg total) by mouth 2 (two) times daily. 14 capsule 0  . fluconazole (DIFLUCAN) 150 MG tablet 1 tab PO once. Repeat in 3 days if symptoms  persist 2 tablet 0  . lubiprostone (AMITIZA) 8 MCG capsule Take 1 capsule (8 mcg total) by mouth 2 (two) times daily with a meal. 60 capsule 0  . montelukast (SINGULAIR) 10 MG tablet TAKE ONE TABLET BY MOUTH DAILY AT BEDTIME 90 tablet 0  . Multiple Vitamins-Minerals (MULTIVITAMIN WITH MINERALS) tablet Take 1 tablet by mouth daily.    . naproxen sodium (ANAPROX) 220 MG tablet Take 220 mg by mouth 1 day or 1 dose.    . pramoxine (PROCTOFOAM) 1 % foam Place 1 application rectally 3 (three) times daily as needed for anal irritation. 15 g 0  . temazepam (RESTORIL) 15 MG capsule TK ONE C PO HS  4  . triamcinolone cream (KENALOG) 0.1 % Apply 1 application topically 2 (two) times daily. To left forearm 30 g 0   No current facility-administered medications for this visit.     Allergies  Allergen Reactions  . Latex       Review of Systems:  Constitutional:  No  fever, no chills, +recent illness, No unintentional weight changes. +significant fatigue.   HEENT: No  headache, no vision change  Cardiac: No  chest pain, No  pressure, No palpitations, No  Orthopnea  Respiratory:  No  shortness of breath  Gastrointestinal: No  abdominal pain, No  nausea, No  vomiting,  No  blood in stool, No  diarrhea, +chronic constipation   Musculoskeletal: No new myalgia/arthralgia  Skin: +Rash, No other wounds/concerning lesions  Genitourinary: No  incontinence, No  abnormal genital bleeding, No abnormal genital discharge  Hem/Onc: No  easy bruising/bleeding  Neurologic: No  weakness, No  dizziness  Psychiatric: No  concerns with depression, No  concerns with anxiety  Exam:  BP (!) 172/96 (BP Location: Right Arm)   Pulse 77   Temp (!) 97.4 F (36.3 C) (Oral)   Wt 134 lb 1.9 oz (60.8 kg)   LMP  (LMP Unknown)   BMI 21.01 kg/m   Constitutional: VS see above. General Appearance: alert, well-developed, well-nourished, NAD  Eyes: Normal lids and conjunctive, non-icteric sclera  Ears, Nose, Mouth,  Throat: MMM, Normal external inspection ears/nares/mouth/lips/gums.   Neck: No masses, trachea midline.   Respiratory: Normal respiratory effort. no wheeze, no rhonchi, no rales  Cardiovascular: S1/S2 normal, no murmur, no rub/gallop auscultated. RRR  Gastrointestinal: Nontender, no masses.   Musculoskeletal: Gait normal.   Neurological: Normal balance/coordination. No tremor  Skin: warm, dry  Psychiatric: Normal judgment/insight. Normal mood and affect. Oriented x3.  GYN: No lesions/ulcers to external genitalia, mild persistent erythema/irrittaionto L labia majora, atrophic changes in vagina especially at introitus posteriorly, irritation to but no polys at urethra, atrophic vaginal mucosa, physiologic discharge, cervix normal without lesions, uterus not enlarged or tender, adnexa no masses and nontender    Results for orders placed or performed in visit on 12/22/17 (from the past 72 hour(s))  POCT Urinalysis Dipstick     Status: None   Collection Time: 12/22/17  9:50 AM  Result Value Ref Range   Color, UA YELLOW    Clarity, UA CLEAR    Glucose,  UA NEGATIVE    Bilirubin, UA NEGATIVE    Ketones, UA NEGATIVE    Spec Grav, UA 1.010 1.010 - 1.025   Blood, UA NEGATIVE    pH, UA 7.0 5.0 - 8.0   Protein, UA NEGATIVE    Urobilinogen, UA 0.2 0.2 or 1.0 E.U./dL   Nitrite, UA NEGATIVE    Leukocytes, UA Negative Negative   Appearance     Odor        ASSESSMENT/PLAN:   Postmenopause atrophic vaginitis - Plan: Urine Culture, Urinalysis, microscopic only, POCT Urinalysis Dipstick  Labial swelling - Plan: fluconazole (DIFLUCAN) 150 MG tablet, Urine Culture, Urinalysis, microscopic only, POCT Urinalysis Dipstick  Hyponatremia - Plan: BASIC METABOLIC PANEL WITH GFR, Urine Culture, Urinalysis, microscopic only, POCT Urinalysis Dipstick  Essential hypertension - would like to restart previous meds, will await lab results     Patient Instructions  Plan:  Repeat course of Diflucan  to ensure resolution of yeast infection at the skin.  Will start vaginal estrogen  Will recheck labs to keep and ions sodium and potassium  Blood pressure is expected to be elevated since they stopped the diuretics. For now, let's wait on lab results     Visit summary with medication list and pertinent instructions was printed for patient to review. All questions at time of visit were answered - patient instructed to contact office with any additional concerns. ER/RTC precautions were reviewed with the patient.   Follow-up plan: Return in about 1 week (around 12/29/2017) for recheck blood pressure, sooner if needed .  Note: Total time spent 40 minutes, greater than 50% of the visit was spent face-to-face counseling and coordinating care for the following: The primary encounter diagnosis was Postmenopause atrophic vaginitis. Diagnoses of Labial swelling, Hyponatremia, and Essential hypertension were also pertinent to this visit.Marland Kitchen  Please note: voice recognition software was used to produce this document, and typos may escape review. Please contact Dr. Lyn Hollingshead for any needed clarifications.

## 2017-12-22 NOTE — Patient Instructions (Addendum)
Plan:  Repeat course of Diflucan to ensure resolution of yeast infection at the skin.  Will start vaginal estrogen  Will recheck labs to keep and ions sodium and potassium  Blood pressure is expected to be elevated since they stopped the diuretics. For now, let's wait on lab results

## 2017-12-23 ENCOUNTER — Other Ambulatory Visit: Payer: Self-pay | Admitting: Osteopathic Medicine

## 2017-12-23 DIAGNOSIS — E871 Hypo-osmolality and hyponatremia: Secondary | ICD-10-CM

## 2017-12-23 LAB — URINE CULTURE
MICRO NUMBER: 90155026
Result:: NO GROWTH
SPECIMEN QUALITY:: ADEQUATE

## 2017-12-23 NOTE — Progress Notes (Unsigned)
Add one hyponatremia

## 2017-12-24 LAB — BASIC METABOLIC PANEL WITH GFR
BUN: 9 mg/dL (ref 7–25)
CO2: 27 mmol/L (ref 20–32)
Calcium: 9.8 mg/dL (ref 8.6–10.4)
Chloride: 99 mmol/L (ref 98–110)
Creat: 0.74 mg/dL (ref 0.50–0.99)
GFR, Est African American: 96 mL/min/{1.73_m2} (ref >=60)
GFR, Est Non African American: 83 mL/min/{1.73_m2} (ref >=60)
GLUCOSE: 96 mg/dL (ref 65–99)
POTASSIUM: 3.7 mmol/L (ref 3.5–5.3)
SODIUM: 132 mmol/L — AB (ref 135–146)

## 2017-12-24 LAB — OSMOLALITY: Osmolality: 272 mosm/kg — ABNORMAL LOW (ref 278–305)

## 2017-12-26 LAB — URINALYSIS, MICROSCOPIC ONLY
Bacteria, UA: NONE SEEN /HPF
HYALINE CAST: NONE SEEN /LPF
RBC / HPF: NONE SEEN /HPF (ref 0–2)
Squamous Epithelial / LPF: NONE SEEN /HPF (ref <=5)
WBC, UA: NONE SEEN /HPF (ref 0–5)

## 2017-12-26 LAB — SODIUM, URINE, RANDOM: Sodium, Ur: 41 mmol/L (ref 28–272)

## 2017-12-26 LAB — TEST AUTHORIZATION 2

## 2017-12-26 LAB — CREATININE, URINE, RANDOM: Creatinine, Urine: 12 mg/dL — ABNORMAL LOW (ref 20–275)

## 2017-12-26 LAB — OSMOLALITY, URINE: OSMOLALITY UR: 151 mosm/kg (ref 50–1200)

## 2017-12-29 ENCOUNTER — Ambulatory Visit (INDEPENDENT_AMBULATORY_CARE_PROVIDER_SITE_OTHER): Payer: Medicare Other | Admitting: Osteopathic Medicine

## 2017-12-29 ENCOUNTER — Encounter: Payer: Self-pay | Admitting: Osteopathic Medicine

## 2017-12-29 VITALS — BP 124/75 | HR 94 | Temp 98.3°F | Wt 130.1 lb

## 2017-12-29 DIAGNOSIS — E871 Hypo-osmolality and hyponatremia: Secondary | ICD-10-CM | POA: Diagnosis not present

## 2017-12-29 DIAGNOSIS — R768 Other specified abnormal immunological findings in serum: Secondary | ICD-10-CM

## 2017-12-29 DIAGNOSIS — N952 Postmenopausal atrophic vaginitis: Secondary | ICD-10-CM | POA: Diagnosis not present

## 2017-12-29 NOTE — Progress Notes (Signed)
HPI: Judy [name] is a 72 y.o. female who  has a past medical history of Asthma, GERD (gastroesophageal reflux disease), Hyperlipidemia, and Hypertension.  she presents to [facility] today, 12/29/17,  for chief complaint of: Establish care Hospital follow-up Rash recheck   Vaginal/yeast issue has cleared up. Vaginal estrogen, tolerating okay except some of the paced seems to be taking out overnight, otherwise no problems.  12/06/2017, went to the ER for complaints of mental status change, nausea, decreased oral intake, has been was concerned she might be having a stroke. Sodium 112, potassium 3, head CT was normal, EKG normal with QT interval 437. She was admitted to the hospital for 3 days for hyponatremia, acute metabolic encephalopathy, hypertension, hypokalemia. Hyponatremia felt to be due to dehydration, patient responded well to IV fluids. Discontinued Triamterene-HCT but Riley Nearing was not on her med list here - had been stopped by [facility] when she was hyponatremia in the past but looks like she kept taking this. At any rate, patient's encephalopathy resolved on correction of hyponatremia though she was still a little low at discharge so we repeated the labs at her recheck here 12/22/2017.   Pt also brings labs concerning for low immunoglobuin levels, never followed up about this, this is the first time I'm aware of this issue. See scanned docs/abstracted labs.       Past medical, surgical, social and family history reviewed:  Patient Active Problem List   Diagnosis Date Noted  . Acute anterior anal fissure 12/10/2017  . Labial swelling 12/10/2017  . Irritable bowel syndrome with constipation 12/10/2017  . Dysuria 12/10/2017  . Hypotension due to drugs 09/12/2017  . Hyponatremia 09/09/2017  . 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 Constipation due to slow transit 08/17/2017  . Right lower quadrant abdominal pain 08/10/2017  . Primary insomnia  08/10/2017  . Decreased hemoglobin 08/10/2017  . Hypertension 10/05/2012  . Hyperlipidemia 10/05/2012  . Asthma 10/05/2012  . GERD (gastroesophageal reflux disease) 10/05/2012  . History of colonic polyps 10/05/2012    Past Surgical History:  Procedure Laterality Date  . APPENDECTOMY    . JOINT REPLACEMENT  2005   Left Hip  . RHINOPLASTY     30 years ago    Social History   Tobacco Use  . Smoking status: Never Smoker  . Smokeless tobacco: Never Used  Substance Use Topics  . Alcohol use: Yes    Alcohol/week: 1.8 oz    Types: 3 Glasses of wine per week    Family History  Problem Relation Age of Onset  . Diabetes Maternal Grandmother   . Hypertension Maternal Grandmother   . Stroke Maternal Grandmother   . Hypertension Mother   . Stroke Mother   . Diabetes Sister   . Cancer Maternal Aunt   . Breast cancer Maternal Aunt 60  . Ovarian cancer Neg Hx      Current medication list and allergy/intolerance information reviewed:    Current Outpatient Medications  Medication Sig Dispense Refill  . atorvastatin (LIPITOR) 20 MG tablet TAKE ONE TABLET BY MOUTH DAILY 90 tablet 0  . benazepril (LOTENSIN) 20 MG tablet Take 1 tablet (20 mg total) by mouth 2 (two) times daily. 60 tablet 1  . estradiol (ESTRACE VAGINAL) 0.1 MG/GM vaginal cream Place 1 Applicatorful vaginally at bedtime. For one week, then 2-3 times per week after that 42.5 g 12  . fluconazole (DIFLUCAN) 150 MG tablet 1 tab PO once. Repeat in 3 days if symptoms persist  2 tablet 0  . montelukast (SINGULAIR) 10 MG tablet TAKE ONE TABLET BY MOUTH DAILY AT BEDTIME 90 tablet 0  . Multiple Vitamins-Minerals (MULTIVITAMIN WITH MINERALS) tablet Take 1 tablet by mouth daily.    . naproxen sodium (ANAPROX) 220 MG tablet Take 220 mg by mouth 1 day or 1 dose.    . pramoxine (PROCTOFOAM) 1 % foam Place 1 application rectally 3 (three) times daily as needed for anal irritation. 15 g 0  . temazepam (RESTORIL) 15 MG capsule TK ONE C  PO HS  4  . triamcinolone cream (KENALOG) 0.1 % Apply 1 application topically 2 (two) times daily. To left forearm 30 g 0   No current facility-administered medications for this visit.     Allergies  Allergen Reactions  . Keflex [Cephalexin] Hives and Nausea And Vomiting    12/22/17 - As per pt.  . Latex       Review of Systems:  Constitutional:  No  fever, no chills, +recent illness, No unintentional weight changes. +significant fatigue.   HEENT: No  headache, no vision change  Cardiac: No  chest pain, No  pressure, No palpitations, No  Orthopnea  Respiratory:  No  shortness of breath  Gastrointestinal: No  abdominal pain, No  nausea, No  vomiting,  No  blood in stool, No  diarrhea, +chronic constipation   Musculoskeletal: No new myalgia/arthralgia  Skin: +Rash, No other wounds/concerning lesions  Genitourinary: No  incontinence, No  abnormal genital bleeding, No abnormal genital discharge  Hem/Onc: No  easy bruising/bleeding  Neurologic: No  weakness, No  dizziness  Psychiatric: No  concerns with depression, No  concerns with anxiety  Exam:  BP 124/75   Pulse 94   Temp 98.3 F (36.8 C) (Oral)   Wt 130 lb 1.3 oz (59 kg)   LMP  (LMP Unknown)   BMI 20.37 kg/m   Constitutional: VS see above. General Appearance: alert, well-developed, well-nourished, NAD  Eyes: Normal lids and conjunctive, non-icteric sclera  Ears, Nose, Mouth, Throat: MMM, Normal external inspection ears/nares/mouth/lips/gums.   Neck: No masses, trachea midline.   Respiratory: Normal respiratory effort. no wheeze, no rhonchi, no rales  Cardiovascular: S1/S2 normal, no murmur, no rub/gallop auscultated. RRR  Gastrointestinal: Nontender, no masses.   Musculoskeletal: Gait normal.   Neurological: Normal balance/coordination. No tremor  Skin: warm, dry  Psychiatric: Normal judgment/insight. Normal mood and affect. Oriented x3.      Results for orders placed or performed in visit on  12/29/17 (from the past 72 hour(s))  Creatinine, Urine     Status: None   Collection Time: 12/29/17 11:36 AM  Result Value Ref Range   Creatinine, Urine 41 20 - 275 mg/dL  Sodium, urine, random     Status: None   Collection Time: 12/29/17 11:36 AM  Result Value Ref Range   Sodium, Ur 34 28 - 272 mmol/L  Osmolality, urine     Status: None   Collection Time: 12/29/17 11:36 AM  Result Value Ref Range   Osmolality, Ur 325 50 - 1,200 mOsm/kg  Osmolality     Status: Abnormal   Collection Time: 12/29/17 11:36 AM  Result Value Ref Range   Osmolality 272 (L) 278 - 305 mOsm/kg  BASIC METABOLIC PANEL WITH GFR     Status: Abnormal   Collection Time: 12/29/17 11:36 AM  Result Value Ref Range   Glucose, Bld 93 65 - 99 mg/dL    Comment: .  Fasting reference interval .    BUN 16 7 - 25 mg/dL   Creat 4.09 8.11 - 9.14 mg/dL    Comment: For patients >89 years of age, the reference limit for Creatinine is approximately 13% higher for people identified as African-American. .    GFR, Est Non African American 92 > OR = 60 mL/min/1.65m2   GFR, Est African American 107 > OR = 60 mL/min/1.90m2   BUN/Creatinine Ratio NOT APPLICABLE 6 - 22 (calc)   Sodium 131 (L) 135 - 146 mmol/L   Potassium 4.1 3.5 - 5.3 mmol/L   Chloride 97 (L) 98 - 110 mmol/L   CO2 24 20 - 32 mmol/L   Calcium 9.9 8.6 - 10.4 mg/dL   Patient brings lab report from blood draw 11/25/2017. 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 I'm not sure why immunoglobulins were tested but IgG and IgM levels were low - IgG at 498, IgM at 27, IgA was normal at 100   ASSESSMENT/PLAN:   Hyponatremia - Persistent euvolemic hypotonic hyponatremia - TSH and cortisol levels okay in the hospital. Diuretics removed from med list. 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 All questions at time of visit were answered - patient instructed to contact office with any additional concerns. ER/RTC precautions were reviewed with the patient.   Follow-up plan: Return for depending on labs - for sure in 3 months to recheck blood pressure .  Note: Total time spent 40 minutes, greater than 50% of the visit was spent face-to-face counseling and coordinating care for the following: The primary encounter diagnosis was Hyponatremia. Diagnoses of Postmenopause atrophic vaginitis and Low immunoglobulin level were also pertinent to this visit.Marland Kitchen  Please note: voice recognition software was used to produce this document, and typos may escape review. Please contact Dr. Lyn Hollingshead for any needed clarifications.

## 2017-12-30 LAB — BASIC METABOLIC PANEL WITH GFR
BUN: 16 mg/dL (ref 7–25)
CHLORIDE: 97 mmol/L — AB (ref 98–110)
CO2: 24 mmol/L (ref 20–32)
Calcium: 9.9 mg/dL (ref 8.6–10.4)
Creat: 0.62 mg/dL (ref 0.50–0.99)
GFR, EST AFRICAN AMERICAN: 107 mL/min/{1.73_m2} (ref >=60)
GFR, EST NON AFRICAN AMERICAN: 92 mL/min/{1.73_m2} (ref >=60)
Glucose, Bld: 93 mg/dL (ref 65–99)
Potassium: 4.1 mmol/L (ref 3.5–5.3)
Sodium: 131 mmol/L — ABNORMAL LOW (ref 135–146)

## 2017-12-30 LAB — OSMOLALITY: Osmolality: 272 mOsm/kg — ABNORMAL LOW (ref 278–305)

## 2017-12-30 LAB — SODIUM, URINE, RANDOM: SODIUM UR: 34 mmol/L (ref 28–272)

## 2017-12-30 LAB — OSMOLALITY, URINE: OSMOLALITY UR: 325 mosm/kg (ref 50–1200)

## 2017-12-30 LAB — CREATININE, URINE, RANDOM: Creatinine, Urine: 41 mg/dL (ref 20–275)

## 2017-12-31 DIAGNOSIS — I10 Essential (primary) hypertension: Secondary | ICD-10-CM | POA: Diagnosis not present

## 2017-12-31 DIAGNOSIS — R194 Change in bowel habit: Secondary | ICD-10-CM | POA: Diagnosis not present

## 2017-12-31 DIAGNOSIS — K5900 Constipation, unspecified: Secondary | ICD-10-CM | POA: Diagnosis not present

## 2018-01-05 LAB — VIRAL CULTURE VIRC
MICRO NUMBER: 90103045
SPECIMEN QUALITY:: ADEQUATE

## 2018-01-14 ENCOUNTER — Other Ambulatory Visit: Payer: Self-pay | Admitting: Family

## 2018-01-14 DIAGNOSIS — R768 Other specified abnormal immunological findings in serum: Secondary | ICD-10-CM

## 2018-01-15 ENCOUNTER — Inpatient Hospital Stay: Payer: Medicare Other | Attending: Family | Admitting: Family

## 2018-01-15 ENCOUNTER — Inpatient Hospital Stay: Payer: Medicare Other

## 2018-01-15 VITALS — BP 143/91 | HR 73 | Temp 97.7°F | Wt 128.8 lb

## 2018-01-15 DIAGNOSIS — Z79899 Other long term (current) drug therapy: Secondary | ICD-10-CM | POA: Insufficient documentation

## 2018-01-15 DIAGNOSIS — M25551 Pain in right hip: Secondary | ICD-10-CM | POA: Diagnosis not present

## 2018-01-15 DIAGNOSIS — R630 Anorexia: Secondary | ICD-10-CM

## 2018-01-15 DIAGNOSIS — E785 Hyperlipidemia, unspecified: Secondary | ICD-10-CM | POA: Diagnosis not present

## 2018-01-15 DIAGNOSIS — R1031 Right lower quadrant pain: Secondary | ICD-10-CM | POA: Insufficient documentation

## 2018-01-15 DIAGNOSIS — I10 Essential (primary) hypertension: Secondary | ICD-10-CM | POA: Insufficient documentation

## 2018-01-15 DIAGNOSIS — R102 Pelvic and perineal pain: Secondary | ICD-10-CM | POA: Insufficient documentation

## 2018-01-15 DIAGNOSIS — J45909 Unspecified asthma, uncomplicated: Secondary | ICD-10-CM | POA: Diagnosis not present

## 2018-01-15 DIAGNOSIS — R110 Nausea: Secondary | ICD-10-CM | POA: Diagnosis not present

## 2018-01-15 DIAGNOSIS — R768 Other specified abnormal immunological findings in serum: Secondary | ICD-10-CM | POA: Insufficient documentation

## 2018-01-15 DIAGNOSIS — R634 Abnormal weight loss: Secondary | ICD-10-CM | POA: Diagnosis not present

## 2018-01-15 DIAGNOSIS — K219 Gastro-esophageal reflux disease without esophagitis: Secondary | ICD-10-CM | POA: Insufficient documentation

## 2018-01-15 DIAGNOSIS — Z881 Allergy status to other antibiotic agents status: Secondary | ICD-10-CM | POA: Insufficient documentation

## 2018-01-15 DIAGNOSIS — K5909 Other constipation: Secondary | ICD-10-CM | POA: Insufficient documentation

## 2018-01-15 DIAGNOSIS — Z803 Family history of malignant neoplasm of breast: Secondary | ICD-10-CM | POA: Diagnosis not present

## 2018-01-15 LAB — CMP (CANCER CENTER ONLY)
ALK PHOS: 76 U/L (ref 40–150)
ALT: 16 U/L (ref 0–55)
AST: 14 U/L (ref 5–34)
Albumin: 4.2 g/dL (ref 3.5–5.0)
Anion gap: 11 (ref 3–11)
BUN: 12 mg/dL (ref 7–26)
CALCIUM: 10.2 mg/dL (ref 8.4–10.4)
CHLORIDE: 102 mmol/L (ref 98–109)
CO2: 24 mmol/L (ref 22–29)
CREATININE: 0.75 mg/dL (ref 0.60–1.10)
GFR, Estimated: >60 mL/min (ref >60)
Glucose, Bld: 90 mg/dL (ref 70–140)
Potassium: 3.9 mmol/L (ref 3.5–5.1)
SODIUM: 137 mmol/L (ref 136–145)
Total Bilirubin: 0.6 mg/dL (ref 0.2–1.2)
Total Protein: 6.5 g/dL (ref 6.4–8.3)

## 2018-01-15 LAB — CBC WITH DIFFERENTIAL (CANCER CENTER ONLY)
Basophils Absolute: 0 10*3/uL (ref 0.0–0.1)
Basophils Relative: 1 %
EOS ABS: 0.1 10*3/uL (ref 0.0–0.5)
EOS PCT: 1 %
HCT: 36.4 % (ref 34.8–46.6)
Hemoglobin: 12.6 g/dL (ref 11.6–15.9)
LYMPHS ABS: 1.1 10*3/uL (ref 0.9–3.3)
LYMPHS PCT: 18 %
MCH: 34.6 pg — AB (ref 26.0–34.0)
MCHC: 34.6 g/dL (ref 32.0–36.0)
MCV: 100 fL (ref 81.0–101.0)
MONO ABS: 0.7 10*3/uL (ref 0.1–0.9)
MONOS PCT: 11 %
Neutro Abs: 4.2 10*3/uL (ref 1.5–6.5)
Neutrophils Relative %: 69 %
PLATELETS: 277 10*3/uL (ref 145–400)
RBC: 3.64 MIL/uL — ABNORMAL LOW (ref 3.70–5.32)
RDW: 13 % (ref 11.1–15.7)
WBC Count: 6 10*3/uL (ref 3.9–10.0)

## 2018-01-15 LAB — LACTATE DEHYDROGENASE: LDH: 155 U/L (ref 125–245)

## 2018-01-15 LAB — SAVE SMEAR

## 2018-01-15 NOTE — Progress Notes (Signed)
Hematology/Oncology Consultation   Name: Judy [name]      MRN: [id number]    Location: Room/bed info not found  Date: 01/15/2018 Time:11:13 AM   REFERRING PHYSICIAN: Sunnie Nielsen, DO  REASON FOR CONSULT: Low immunoglobulin level    DIAGNOSIS: Low IgG and IgM levels  HISTORY OF PRESENT ILLNESS: Judy [name] is a very pleasant 72 yo caucasian female with low immunoglobulin levels noted on recent lab work. In January, IgG was 498 and IgM 27. IgA was 100.  She is originally from [place] and moved to [place] to be closer to Judy [name] stepchildren.  She has had no issue with infections.  She states that she had Judy [name] appendix removed a little over a year ago. Several weeks after she reached above Judy [name] head and felt something "pop/pull" in Judy right side. Since then she has had burning and stabbing pain in Judy [name] right side and pelvis. She states that when she is in bed she can see a protrusion from Judy lower right pelvic area. On exam I could palpate a small protrusion when she sat up on Judy table. She has to stand and walk quite often to relieve Judy pain. This has Judy [name] quite discouraged and she is tearful discussing it today.  She also has hip pain at times.  Judy [name] last colonoscopy she states was in 2015 and was negative.  She was placed on Keflex for a labial lesion and developed severe n/v leading to hyponatremia and hospitalization with [facility] in late January.  She history of chronic constipation. This worsened after starting Lynzess.  She states that she has lost 10 lbs in Judy last 6 months and is unable to gain weight. Judy [name] appetite is down and she has had nausea without vomiting when eating. She hydrates well with water.  No fever, chills, cough, rash, dizziness, SOB, chest pain, palpitations or changes in bladder habits.  She uses an inhaler as needed for mild asthma.  No personal history of cancer. Family history includes a maternal aunt with breast cancer. Judy [name] father disappeared when she was a small child  so she has not paternal health history information.  She has never been pregnant.  She does not smoke and only has Judy occasional glass of wine socially.  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   ROS: All other 10 point review of systems is negative.   PAST MEDICAL HISTORY:   Past Medical History:  Diagnosis Date  . Asthma   . GERD (gastroesophageal reflux disease)   . Hyperlipidemia   . Hypertension     ALLERGIES: Allergies  Allergen Reactions  . Keflex [Cephalexin] Hives and Nausea And Vomiting    12/22/17 - As per pt.  . Latex       MEDICATIONS:  Current Outpatient Medications on File Prior to Visit  Medication Sig Dispense Refill  . atorvastatin (LIPITOR) 20 MG tablet TAKE ONE TABLET BY MOUTH DAILY 90 tablet 0  . benazepril (LOTENSIN) 20 MG tablet Take 1 tablet (20 mg total) by mouth 2 (two) times daily. 60 tablet 1  . estradiol (ESTRACE VAGINAL) 0.1 MG/GM vaginal cream Place 1 Applicatorful vaginally at bedtime. For one week, then 2-3 times per week after that 42.5 g 12  . fluconazole (DIFLUCAN) 150 MG tablet 1 tab PO once. Repeat in 3 days if symptoms persist ([name] not taking: Reported on 12/29/2017) 2 tablet 0  . montelukast (SINGULAIR) 10 MG tablet TAKE ONE TABLET BY MOUTH DAILY AT BEDTIME 90 tablet 0  .  Multiple Vitamins-Minerals (MULTIVITAMIN WITH MINERALS) tablet Take 1 tablet by mouth daily.    . naproxen sodium (ANAPROX) 220 MG tablet Take 220 mg by mouth 1 day or 1 dose.    . pramoxine (PROCTOFOAM) 1 % foam Place 1 application rectally 3 (three) times daily as needed for anal irritation. ([name] not taking: Reported on 12/29/2017) 15 g 0  . temazepam (RESTORIL) 15 MG capsule TK ONE C PO HS  4  . triamcinolone cream (KENALOG) 0.1 % Apply 1 application topically 2 (two) times daily. To left forearm 30 g 0   No current facility-administered medications on file prior to visit.      PAST SURGICAL HISTORY Past Surgical History:  Procedure Laterality Date   . APPENDECTOMY    . JOINT REPLACEMENT  2005   Left Hip  . RHINOPLASTY     30 years ago    FAMILY HISTORY: Family History  Problem Relation Age of Onset  . Diabetes Maternal Grandmother   . Hypertension Maternal Grandmother   . Stroke Maternal Grandmother   . Hypertension Mother   . Stroke Mother   . Diabetes Sister   . Cancer Maternal Aunt   . Breast cancer Maternal Aunt 60  . Ovarian cancer Neg Hx     SOCIAL HISTORY:  reports that  has never smoked. she has never used smokeless tobacco. She reports that she drinks about 1.8 oz of alcohol per week. She reports that she does not use drugs.  PERFORMANCE STATUS: Judy [name]'s performance status is 1 - Symptomatic but completely ambulatory  PHYSICAL EXAM: Most Recent Vital Signs: There were no vitals taken for this visit. BP (!) 143/91 ([name] Position: Sitting)   Pulse 73   Temp 97.7 F (36.5 C) (Oral)   Wt 128 lb 12 oz (58.4 kg)   LMP  (LMP Unknown)   SpO2 100%   BMI 20.17 kg/m   General Appearance:    Alert, cooperative, no distress, appears stated age  Head:    Normocephalic, without obvious abnormality, atraumatic  Eyes:    PERRL, conjunctiva/corneas clear, EOM's intact, fundi    benign, both eyes        Throat:   Lips, mucosa, and tongue normal; teeth and gums normal  Neck:   Supple, symmetrical, trachea midline, no adenopathy;    thyroid:  no enlargement/tenderness/nodules; no carotid   bruit or JVD  Back:     Symmetric, no curvature, ROM normal, no CVA tenderness  Lungs:     Clear to auscultation bilaterally, respirations unlabored  Chest Wall:    No tenderness or deformity   Heart:    Regular rate and rhythm, S1 and S2 normal, no murmur, rub   or gallop     Abdomen:     Soft, non-tender, bowel sounds active all four quadrants,    no masses, no organomegaly        Extremities:   Extremities normal, atraumatic, no cyanosis or edema  Pulses:   2+ and symmetric all extremities  Skin:   Skin color,  texture, turgor normal, no rashes or lesions  Lymph nodes:   Cervical, supraclavicular, and axillary nodes normal  Neurologic:   CNII-XII intact, normal strength, sensation and reflexes    throughout    LABORATORY DATA:  Results for orders placed or performed in visit on 01/15/18 (from Judy past 48 hour(s))  CBC with Differential (Cancer Center Only)     Status: Abnormal   Collection Time: 01/15/18 10:51 AM  Result Value  Ref Range   WBC Count 6.0 3.9 - 10.0 K/uL   RBC 3.64 (L) 3.70 - 5.32 MIL/uL   Hemoglobin 12.6 11.6 - 15.9 g/dL   HCT 16.136.4 09.634.8 - 04.546.6 %   MCV 100.0 81.0 - 101.0 fL   MCH 34.6 (H) 26.0 - 34.0 pg   MCHC 34.6 32.0 - 36.0 g/dL   RDW 40.913.0 81.111.1 - 91.415.7 %   Platelet Count 277 145 - 400 K/uL   Neutrophils Relative % 69 %   Neutro Abs 4.2 1.5 - 6.5 K/uL   Lymphocytes Relative 18 %   Lymphs Abs 1.1 0.9 - 3.3 K/uL   Monocytes Relative 11 %   Monocytes Absolute 0.7 0.1 - 0.9 K/uL   Eosinophils Relative 1 %   Eosinophils Absolute 0.1 0.0 - 0.5 K/uL   Basophils Relative 1 %   Basophils Absolute 0.0 0.0 - 0.1 K/uL    Comment: Performed at [facility] at [facility] [place], [address], [place], [place] [address]      RADIOGRAPHY: No results found.     PATHOLOGY: None   ASSESSMENT/PLAN: Judy [name] is a very pleasant 72 yo caucasian female with low immunoglobulin levels noted on recent lab work. Labs for today are pending. No anemia, WBC count 6 and platelet count 277.  She is complaining of significant pain in Judy right abdomen and pelvis. We will get a CT to evaluate for possible hernia.  We will see what Judy [name] labs and scan results reveal and then determine a follow-up plan.   All questions were answered and she is in agreement with Judy plan. She will contact our office with any questions or concerns. We can certainly see Judy [name] sooner if need be.   She was discussed with and also seen by Dr. Myna HidalgoEnnever and he is in agreement with Judy aforementioned.    Emeline GinsSarah [place]    Addendum: I saw and examined Judy [name] with Maralyn SagoSarah.  I agree with Judy above assessment.  I cannot find anything obvious that is affecting Judy [name] that is a hematologic or oncologic issue.  Even if Judy [name] immunoglobulins are little bit low, that would not account for any of Judy [name] symptoms.  I cannot see that she has any obvious cancer.  We will see what a CT scan shows.  I did look at Judy [name] blood smear.  It looks pretty bland from what I could tell.  She had good maturity of Judy [name] white blood cells.  She had no nucleated red blood cells.  She had no platelet clumps.  We spent about 40 minutes with Judy [name] today.  Over 50% of Judy time was spent face-to-face talking to Judy [name] about Judy [name] lab results, we were trying to formulate a plan so that we can try to help Judy [name] and see if we cannot make Judy [name] feel better.  Christin BachPete Ennever, MD

## 2018-01-16 LAB — IGG, IGA, IGM
IgA: 124 mg/dL (ref 87–352)
IgG (Immunoglobin G), Serum: 523 mg/dL — ABNORMAL LOW (ref 700–1600)
IgM (Immunoglobulin M), Srm: 31 mg/dL (ref 26–217)

## 2018-01-18 LAB — PROTEIN ELECTROPHORESIS, SERUM
A/G Ratio: 2 — ABNORMAL HIGH (ref 0.7–1.7)
ALPHA-1-GLOBULIN: 0.2 g/dL (ref 0.0–0.4)
Albumin ELP: 4.1 g/dL (ref 2.9–4.4)
Alpha-2-Globulin: 0.6 g/dL (ref 0.4–1.0)
Beta Globulin: 0.8 g/dL (ref 0.7–1.3)
Gamma Globulin: 0.5 g/dL (ref 0.4–1.8)
Globulin, Total: 2.1 g/dL — ABNORMAL LOW (ref 2.2–3.9)
TOTAL PROTEIN ELP: 6.2 g/dL (ref 6.0–8.5)

## 2018-01-18 LAB — KAPPA/LAMBDA LIGHT CHAINS
Kappa free light chain: 8.6 mg/L (ref 3.3–19.4)
Kappa, lambda light chain ratio: 0.99 (ref 0.26–1.65)
Lambda free light chains: 8.7 mg/L (ref 5.7–26.3)

## 2018-01-22 ENCOUNTER — Ambulatory Visit (HOSPITAL_BASED_OUTPATIENT_CLINIC_OR_DEPARTMENT_OTHER)
Admission: RE | Admit: 2018-01-22 | Discharge: 2018-01-22 | Disposition: A | Discharge status: Home / Self Care | Payer: Medicare Other | Source: Ambulatory Visit | Attending: Family | Admitting: Family

## 2018-01-22 ENCOUNTER — Encounter: Payer: Self-pay | Admitting: Family

## 2018-01-22 DIAGNOSIS — R109 Unspecified abdominal pain: Secondary | ICD-10-CM | POA: Diagnosis not present

## 2018-01-22 DIAGNOSIS — R768 Other specified abnormal immunological findings in serum: Secondary | ICD-10-CM | POA: Diagnosis not present

## 2018-01-22 DIAGNOSIS — R1031 Right lower quadrant pain: Secondary | ICD-10-CM | POA: Diagnosis not present

## 2018-01-22 DIAGNOSIS — M25551 Pain in right hip: Secondary | ICD-10-CM | POA: Diagnosis not present

## 2018-01-22 MED ORDER — IOPAMIDOL (ISOVUE-300) INJECTION 61%
100.0000 mL | Freq: Once | INTRAVENOUS | Status: AC | PRN
  Administered 2018-01-22: 100 mL via INTRAVENOUS

## 2018-01-25 ENCOUNTER — Telehealth: Payer: Self-pay | Admitting: *Deleted

## 2018-01-25 NOTE — Telephone Encounter (Signed)
Called patient and told her that her CT scan was negative for abnormality. Patient stated she is still having pain and requesting further scans such as an MRI.  Asked to speak to our NP.  Message given to our NP to call patient

## 2018-01-26 DIAGNOSIS — H01002 Unspecified blepharitis right lower eyelid: Secondary | ICD-10-CM | POA: Diagnosis not present

## 2018-01-26 DIAGNOSIS — H00015 Hordeolum externum left lower eyelid: Secondary | ICD-10-CM | POA: Diagnosis not present

## 2018-01-26 DIAGNOSIS — H2513 Age-related nuclear cataract, bilateral: Secondary | ICD-10-CM | POA: Diagnosis not present

## 2018-01-26 DIAGNOSIS — H01004 Unspecified blepharitis left upper eyelid: Secondary | ICD-10-CM | POA: Diagnosis not present

## 2018-01-26 DIAGNOSIS — H01001 Unspecified blepharitis right upper eyelid: Secondary | ICD-10-CM | POA: Diagnosis not present

## 2018-01-26 DIAGNOSIS — H01005 Unspecified blepharitis left lower eyelid: Secondary | ICD-10-CM | POA: Diagnosis not present

## 2018-03-23 DIAGNOSIS — R002 Palpitations: Secondary | ICD-10-CM | POA: Diagnosis not present

## 2018-03-23 DIAGNOSIS — K6389 Other specified diseases of intestine: Secondary | ICD-10-CM | POA: Diagnosis not present

## 2018-03-23 DIAGNOSIS — K5909 Other constipation: Secondary | ICD-10-CM | POA: Diagnosis not present

## 2018-03-23 DIAGNOSIS — K432 Incisional hernia without obstruction or gangrene: Secondary | ICD-10-CM | POA: Diagnosis not present

## 2018-03-23 DIAGNOSIS — R192 Visible peristalsis: Secondary | ICD-10-CM | POA: Diagnosis not present

## 2018-03-23 DIAGNOSIS — G4700 Insomnia, unspecified: Secondary | ICD-10-CM | POA: Diagnosis not present

## 2018-03-23 DIAGNOSIS — R634 Abnormal weight loss: Secondary | ICD-10-CM | POA: Diagnosis not present

## 2018-03-23 DIAGNOSIS — F419 Anxiety disorder, unspecified: Secondary | ICD-10-CM | POA: Diagnosis not present

## 2018-03-23 DIAGNOSIS — Z79899 Other long term (current) drug therapy: Secondary | ICD-10-CM | POA: Diagnosis not present

## 2018-03-23 DIAGNOSIS — E871 Hypo-osmolality and hyponatremia: Secondary | ICD-10-CM | POA: Diagnosis not present

## 2018-03-23 DIAGNOSIS — R195 Other fecal abnormalities: Secondary | ICD-10-CM | POA: Diagnosis not present

## 2018-04-05 ENCOUNTER — Ambulatory Visit: Payer: Medicare Other | Admitting: Osteopathic Medicine

## 2018-04-06 DIAGNOSIS — R195 Other fecal abnormalities: Secondary | ICD-10-CM | POA: Diagnosis not present

## 2018-04-06 DIAGNOSIS — K5909 Other constipation: Secondary | ICD-10-CM | POA: Diagnosis not present

## 2018-04-06 DIAGNOSIS — K6389 Other specified diseases of intestine: Secondary | ICD-10-CM | POA: Diagnosis not present

## 2018-04-06 DIAGNOSIS — F419 Anxiety disorder, unspecified: Secondary | ICD-10-CM | POA: Diagnosis not present

## 2018-04-06 DIAGNOSIS — R634 Abnormal weight loss: Secondary | ICD-10-CM | POA: Diagnosis not present

## 2018-04-06 DIAGNOSIS — G4700 Insomnia, unspecified: Secondary | ICD-10-CM | POA: Diagnosis not present

## 2018-04-15 DIAGNOSIS — K30 Functional dyspepsia: Secondary | ICD-10-CM | POA: Diagnosis not present

## 2018-04-15 DIAGNOSIS — R1031 Right lower quadrant pain: Secondary | ICD-10-CM | POA: Diagnosis not present

## 2018-04-15 DIAGNOSIS — R634 Abnormal weight loss: Secondary | ICD-10-CM | POA: Diagnosis not present

## 2018-04-15 DIAGNOSIS — R194 Change in bowel habit: Secondary | ICD-10-CM | POA: Diagnosis not present

## 2018-04-15 DIAGNOSIS — K4090 Unilateral inguinal hernia, without obstruction or gangrene, not specified as recurrent: Secondary | ICD-10-CM | POA: Diagnosis not present

## 2018-04-15 DIAGNOSIS — R140 Abdominal distension (gaseous): Secondary | ICD-10-CM | POA: Diagnosis not present

## 2018-05-13 DIAGNOSIS — Z86010 Personal history of colonic polyps: Secondary | ICD-10-CM | POA: Diagnosis not present

## 2018-05-13 DIAGNOSIS — K635 Polyp of colon: Secondary | ICD-10-CM | POA: Diagnosis not present

## 2018-05-13 DIAGNOSIS — K2950 Unspecified chronic gastritis without bleeding: Secondary | ICD-10-CM | POA: Diagnosis not present

## 2018-05-13 DIAGNOSIS — K222 Esophageal obstruction: Secondary | ICD-10-CM | POA: Diagnosis not present

## 2018-05-13 DIAGNOSIS — K30 Functional dyspepsia: Secondary | ICD-10-CM | POA: Diagnosis not present

## 2018-05-13 DIAGNOSIS — K5730 Diverticulosis of large intestine without perforation or abscess without bleeding: Secondary | ICD-10-CM | POA: Diagnosis not present

## 2018-05-13 DIAGNOSIS — R194 Change in bowel habit: Secondary | ICD-10-CM | POA: Diagnosis not present

## 2018-05-25 ENCOUNTER — Inpatient Hospital Stay: Payer: Medicare Other

## 2018-05-25 ENCOUNTER — Inpatient Hospital Stay: Payer: Medicare Other | Attending: Family | Admitting: Family

## 2018-05-25 ENCOUNTER — Other Ambulatory Visit: Payer: Self-pay | Admitting: Family

## 2018-05-25 DIAGNOSIS — R768 Other specified abnormal immunological findings in serum: Secondary | ICD-10-CM

## 2018-07-08 DIAGNOSIS — K5909 Other constipation: Secondary | ICD-10-CM | POA: Diagnosis not present

## 2018-07-08 DIAGNOSIS — Z79899 Other long term (current) drug therapy: Secondary | ICD-10-CM | POA: Diagnosis not present

## 2018-07-08 DIAGNOSIS — G4700 Insomnia, unspecified: Secondary | ICD-10-CM | POA: Diagnosis not present

## 2018-07-08 DIAGNOSIS — E871 Hypo-osmolality and hyponatremia: Secondary | ICD-10-CM | POA: Diagnosis not present

## 2018-07-08 DIAGNOSIS — R799 Abnormal finding of blood chemistry, unspecified: Secondary | ICD-10-CM | POA: Diagnosis not present

## 2018-07-08 DIAGNOSIS — R634 Abnormal weight loss: Secondary | ICD-10-CM | POA: Diagnosis not present

## 2018-08-09 DIAGNOSIS — E538 Deficiency of other specified B group vitamins: Secondary | ICD-10-CM | POA: Diagnosis not present

## 2018-08-25 DIAGNOSIS — F411 Generalized anxiety disorder: Secondary | ICD-10-CM | POA: Diagnosis not present

## 2018-08-25 DIAGNOSIS — E538 Deficiency of other specified B group vitamins: Secondary | ICD-10-CM | POA: Diagnosis not present

## 2018-09-08 DIAGNOSIS — Z23 Encounter for immunization: Secondary | ICD-10-CM | POA: Diagnosis not present

## 2018-09-08 DIAGNOSIS — E538 Deficiency of other specified B group vitamins: Secondary | ICD-10-CM | POA: Diagnosis not present

## 2018-09-23 DIAGNOSIS — K219 Gastro-esophageal reflux disease without esophagitis: Secondary | ICD-10-CM | POA: Diagnosis not present

## 2018-09-24 DIAGNOSIS — E538 Deficiency of other specified B group vitamins: Secondary | ICD-10-CM | POA: Diagnosis not present

## 2018-09-28 DIAGNOSIS — L814 Other melanin hyperpigmentation: Secondary | ICD-10-CM | POA: Diagnosis not present

## 2018-09-28 DIAGNOSIS — I781 Nevus, non-neoplastic: Secondary | ICD-10-CM | POA: Diagnosis not present

## 2018-09-28 DIAGNOSIS — L570 Actinic keratosis: Secondary | ICD-10-CM | POA: Diagnosis not present

## 2018-10-18 DIAGNOSIS — E871 Hypo-osmolality and hyponatremia: Secondary | ICD-10-CM | POA: Diagnosis not present

## 2018-10-18 DIAGNOSIS — I10 Essential (primary) hypertension: Secondary | ICD-10-CM | POA: Diagnosis not present

## 2018-10-18 DIAGNOSIS — G4700 Insomnia, unspecified: Secondary | ICD-10-CM | POA: Diagnosis not present

## 2018-10-18 DIAGNOSIS — E538 Deficiency of other specified B group vitamins: Secondary | ICD-10-CM | POA: Diagnosis not present

## 2018-10-18 DIAGNOSIS — E785 Hyperlipidemia, unspecified: Secondary | ICD-10-CM | POA: Diagnosis not present

## 2019-01-26 DIAGNOSIS — L600 Ingrowing nail: Secondary | ICD-10-CM | POA: Diagnosis not present

## 2019-01-27 DIAGNOSIS — I10 Essential (primary) hypertension: Secondary | ICD-10-CM | POA: Diagnosis not present

## 2019-01-27 DIAGNOSIS — H2513 Age-related nuclear cataract, bilateral: Secondary | ICD-10-CM | POA: Diagnosis not present

## 2019-01-27 DIAGNOSIS — H35033 Hypertensive retinopathy, bilateral: Secondary | ICD-10-CM | POA: Diagnosis not present

## 2019-01-27 DIAGNOSIS — H43813 Vitreous degeneration, bilateral: Secondary | ICD-10-CM | POA: Diagnosis not present

## 2019-03-09 IMAGING — CT CT ABD-PELV W/ CM
2 of 5 series · 15 of 46 positions shown, 17 images · IV contrast (iopamidol)
Comparison: None.

CLINICAL DATA: Right lower quadrant abdominal pain for 3-4 months.
Constipation. Status post appendectomy November 2016.

EXAM:
CT ABDOMEN AND PELVIS WITH CONTRAST
TECHNIQUE: Multidetector CT imaging of the abdomen and pelvis was performed
using the standard protocol following bolus administration of
intravenous contrast.
CONTRAST:  100mL OW2RVO-HCC IOPAMIDOL (OW2RVO-HCC) INJECTION 61%

[Series 2: axial st · axial · 0.92mm/px · z∈[-453,-53]mm · 12 of 90 slices shown, 14 images]
[im 5/90  soft-tissue]
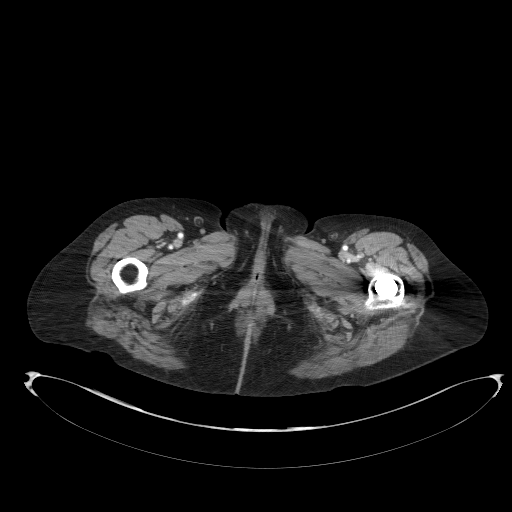
[im 5/90  bone]
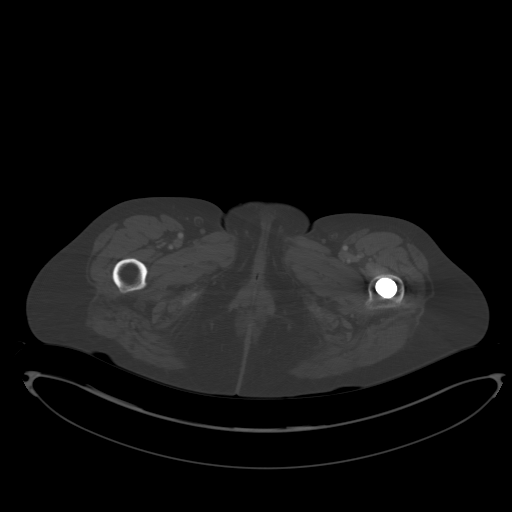
[im 15/90  soft-tissue]
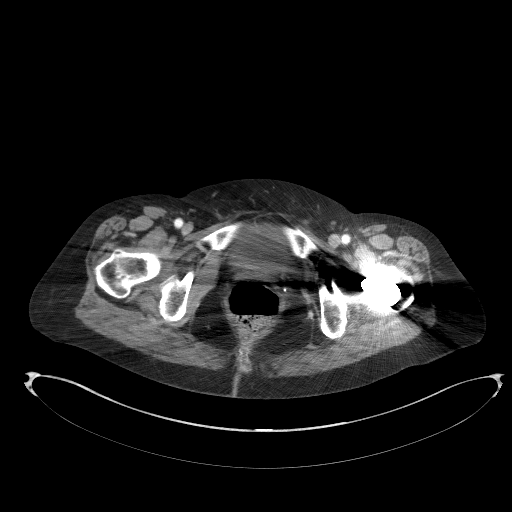
[im 20/90  soft-tissue]
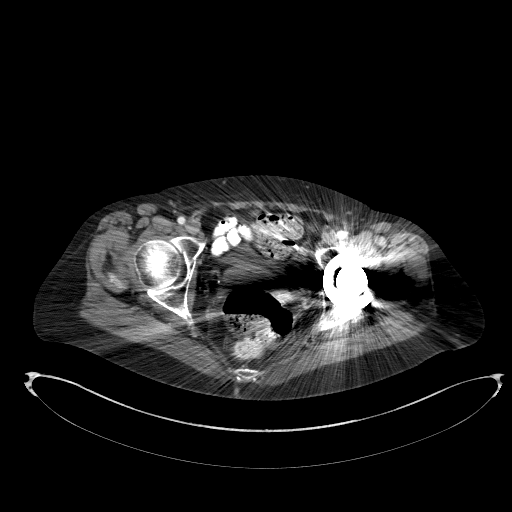
[im 25/90  soft-tissue]
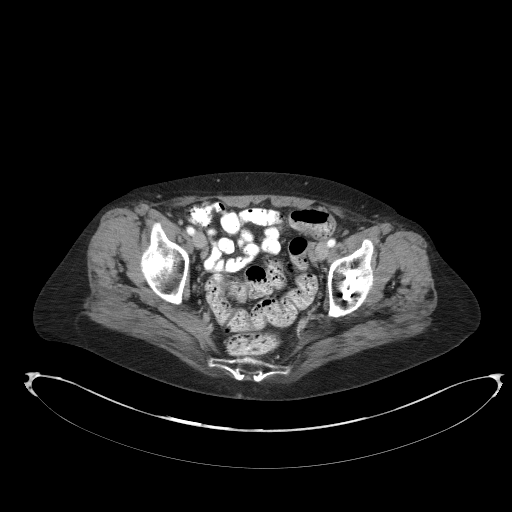
[im 35/90  soft-tissue]
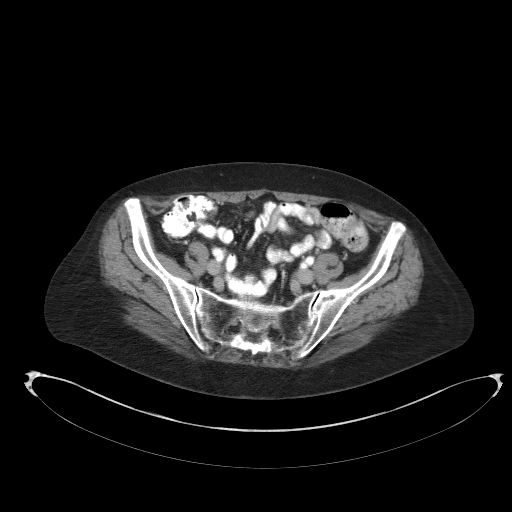
[im 40/90  soft-tissue]
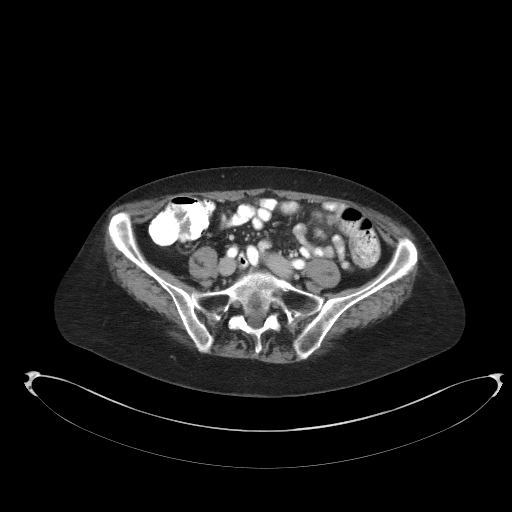
[im 50/90  soft-tissue]
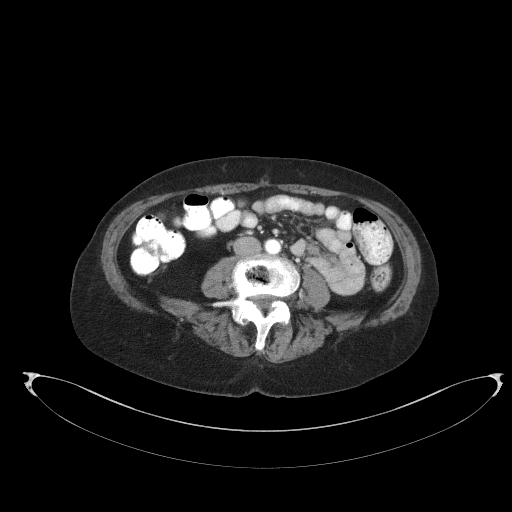
[im 55/90  soft-tissue]
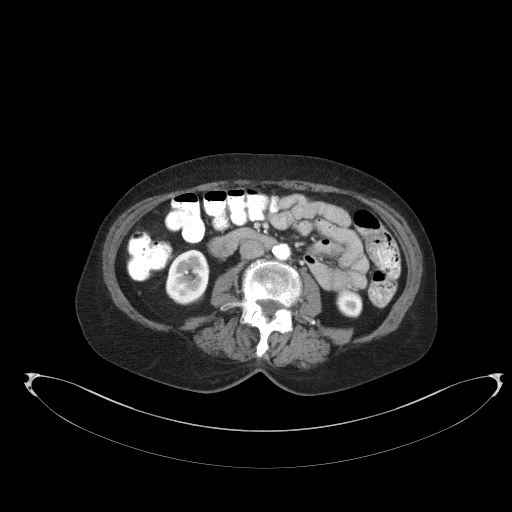
[im 65/90  soft-tissue]
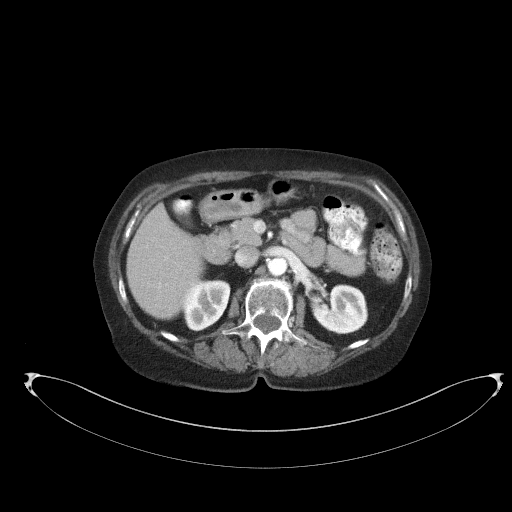
[im 65/90  bone]
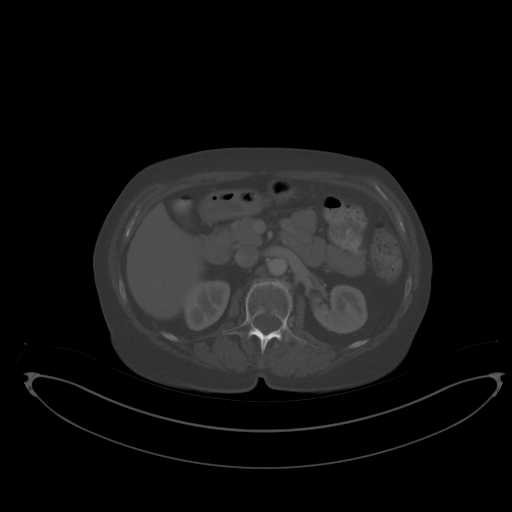
[im 70/90  soft-tissue]
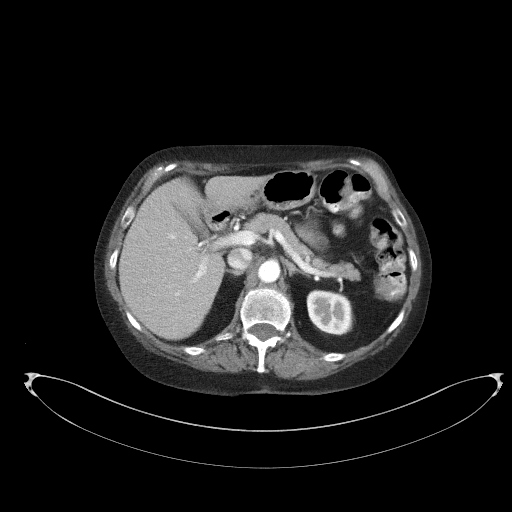
[im 75/90  soft-tissue]
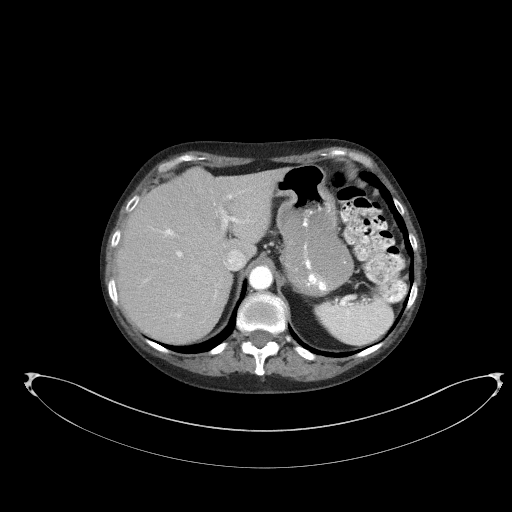
[im 85/90  soft-tissue]
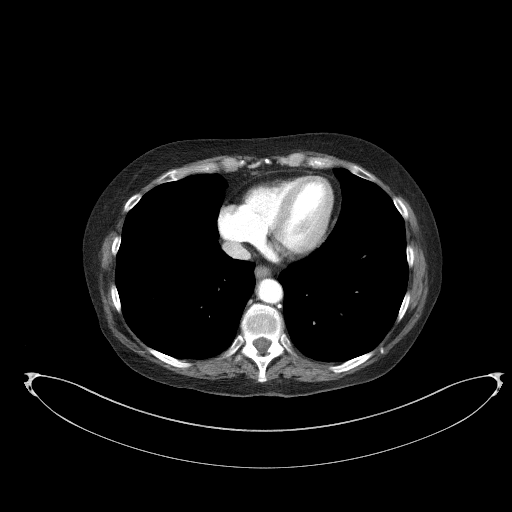

[Series 5: coronal st · coronal · 0.69mm/px · 3 of 81 slices shown]
[im 27/81  soft-tissue]
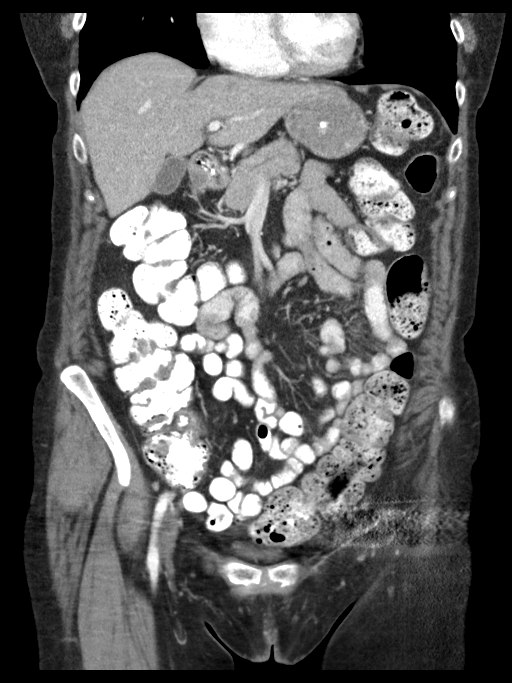
[im 36/81  soft-tissue]
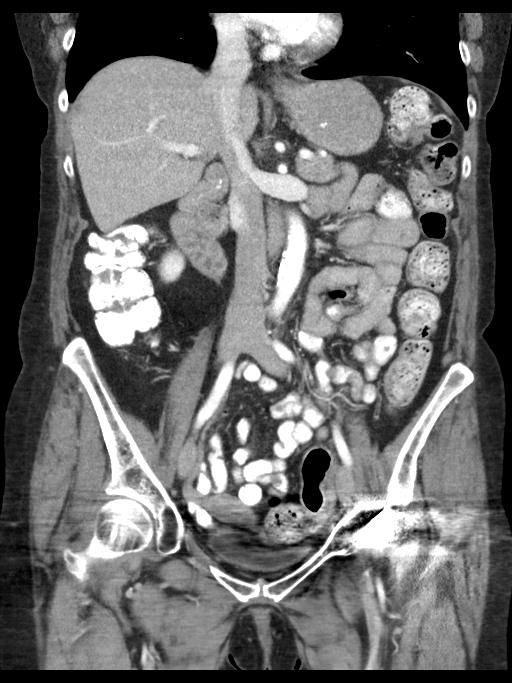
[im 45/81  soft-tissue]
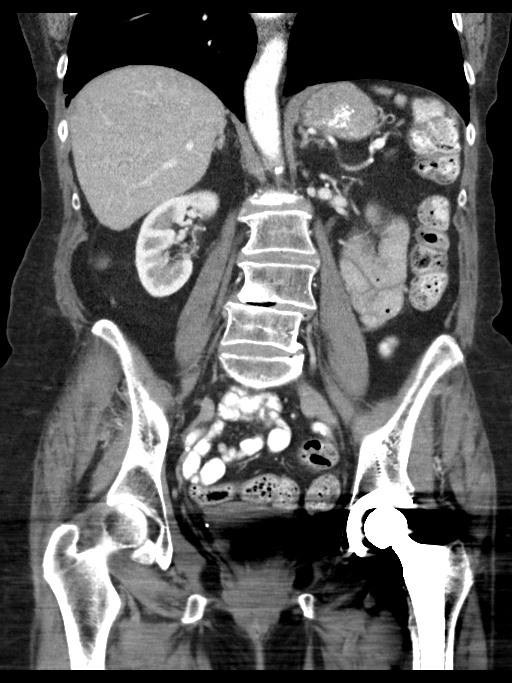

[15 of 46 positions shown; findings below may reference images not displayed]

FINDINGS: Lower chest: No significant pulmonary nodules or acute consolidative
airspace disease.

Hepatobiliary: Normal liver size. Simple 1.3 cm lateral segment left
liver lobe cyst. No additional liver lesions. Normal gallbladder
with no radiopaque cholelithiasis. No biliary ductal dilatation.
Small periampullary duodenum diverticulum.

Pancreas: Normal, with no mass or duct dilation.

Spleen: Normal size. No mass.

Adrenals/Urinary Tract: Normal adrenals. Normal kidneys with no
hydronephrosis and no renal mass. Bladder poorly visualized due to
streak artifact from left hip hardware. No gross bladder
abnormality.

Stomach/Bowel: Grossly normal stomach. Normal caliber small bowel
with no small bowel wall thickening. Status post appendectomy. No
pericecal fat stranding or fluid collections. Normal large bowel
with no diverticulosis, large bowel wall thickening or pericolonic
fat stranding. Oral contrast progresses to the distal rectum.
Moderate colonic stool volume.

Vascular/Lymphatic: Atherosclerotic nonaneurysmal abdominal aorta.
Patent portal, splenic, hepatic and renal veins. No pathologically
enlarged lymph nodes in the abdomen or pelvis.

Reproductive: No adnexal masses. Grossly normal uterus, poorly
visualized due to streak artifact.

Other: No pneumoperitoneum, ascites or focal fluid collection.

Musculoskeletal: No aggressive appearing focal osseous lesions.
Moderate thoracolumbar spondylosis, most prominent at L4-5.
IMPRESSION: 1. No acute abnormality. No evidence of bowel obstruction or acute
bowel inflammation.
2. Status post appendectomy. No fluid collections in the
appendectomy bed. No pericecal inflammatory changes.
3. Moderate colonic stool volume, which may indicate constipation.
4.  Aortic Atherosclerosis ([id number]-NF7.7).
